# Patient Record
Sex: Female | Born: 1980 | Race: White | Hispanic: No | Marital: Married | Smoking: Current every day smoker
Health system: Southern US, Community
[De-identification: ages and names within clinical notes are randomized; demographics above are authoritative.]

## PROBLEM LIST (undated history)

## (undated) DIAGNOSIS — J189 Pneumonia, unspecified organism: Secondary | ICD-10-CM

## (undated) DIAGNOSIS — Z1379 Encounter for other screening for genetic and chromosomal anomalies: Secondary | ICD-10-CM

## (undated) DIAGNOSIS — R7303 Prediabetes: Secondary | ICD-10-CM

## (undated) DIAGNOSIS — D649 Anemia, unspecified: Secondary | ICD-10-CM

## (undated) DIAGNOSIS — I10 Essential (primary) hypertension: Secondary | ICD-10-CM

## (undated) DIAGNOSIS — G4730 Sleep apnea, unspecified: Secondary | ICD-10-CM

## (undated) DIAGNOSIS — M4800 Spinal stenosis, site unspecified: Secondary | ICD-10-CM

## (undated) DIAGNOSIS — Z9889 Other specified postprocedural states: Secondary | ICD-10-CM

## (undated) DIAGNOSIS — D6852 Prothrombin gene mutation: Secondary | ICD-10-CM

## (undated) DIAGNOSIS — Z86711 Personal history of pulmonary embolism: Secondary | ICD-10-CM

## (undated) DIAGNOSIS — R112 Nausea with vomiting, unspecified: Secondary | ICD-10-CM

## (undated) DIAGNOSIS — I8001 Phlebitis and thrombophlebitis of superficial vessels of right lower extremity: Secondary | ICD-10-CM

## (undated) HISTORY — DX: Personal history of pulmonary embolism: Z86.711

## (undated) HISTORY — DX: Phlebitis and thrombophlebitis of superficial vessels of right lower extremity: I80.01

## (undated) HISTORY — DX: Spinal stenosis, site unspecified: M48.00

## (undated) HISTORY — DX: Essential (primary) hypertension: I10

## (undated) HISTORY — DX: Anemia, unspecified: D64.9

## (undated) HISTORY — DX: Sleep apnea, unspecified: G47.30

## (undated) HISTORY — DX: Prothrombin gene mutation: D68.52

## (undated) HISTORY — PX: EXPLORATORY LAPAROTOMY: SUR591

---

## 2000-05-10 HISTORY — PX: BREAST REDUCTION SURGERY: SHX8

## 2000-05-24 ENCOUNTER — Ambulatory Visit (HOSPITAL_BASED_OUTPATIENT_CLINIC_OR_DEPARTMENT_OTHER): Admission: RE | Admit: 2000-05-24 | Discharge: 2000-05-24 | Payer: Self-pay | Admitting: Specialist

## 2000-05-24 ENCOUNTER — Encounter (INDEPENDENT_AMBULATORY_CARE_PROVIDER_SITE_OTHER): Payer: Self-pay | Admitting: Specialist

## 2000-06-16 ENCOUNTER — Other Ambulatory Visit: Admission: RE | Admit: 2000-06-16 | Discharge: 2000-06-16 | Payer: Self-pay | Admitting: Obstetrics and Gynecology

## 2000-06-28 ENCOUNTER — Inpatient Hospital Stay (HOSPITAL_COMMUNITY): Admission: AD | Admit: 2000-06-28 | Discharge: 2000-06-28 | Payer: Self-pay | Admitting: Gynecology

## 2000-08-06 ENCOUNTER — Other Ambulatory Visit: Admission: RE | Admit: 2000-08-06 | Discharge: 2000-08-06 | Payer: Self-pay | Admitting: Gynecology

## 2000-09-13 ENCOUNTER — Encounter (INDEPENDENT_AMBULATORY_CARE_PROVIDER_SITE_OTHER): Payer: Self-pay | Admitting: Specialist

## 2000-09-13 ENCOUNTER — Other Ambulatory Visit: Admission: RE | Admit: 2000-09-13 | Discharge: 2000-09-13 | Payer: Self-pay | Admitting: Gynecology

## 2001-02-06 ENCOUNTER — Encounter (INDEPENDENT_AMBULATORY_CARE_PROVIDER_SITE_OTHER): Payer: Self-pay

## 2001-02-06 ENCOUNTER — Inpatient Hospital Stay (HOSPITAL_COMMUNITY): Admission: AD | Admit: 2001-02-06 | Discharge: 2001-02-08 | Payer: Self-pay | Admitting: Gynecology

## 2001-03-23 ENCOUNTER — Other Ambulatory Visit: Admission: RE | Admit: 2001-03-23 | Discharge: 2001-03-23 | Payer: Self-pay | Admitting: Gynecology

## 2002-05-24 ENCOUNTER — Other Ambulatory Visit: Admission: RE | Admit: 2002-05-24 | Discharge: 2002-05-24 | Payer: Self-pay | Admitting: Gynecology

## 2003-05-31 ENCOUNTER — Other Ambulatory Visit: Admission: RE | Admit: 2003-05-31 | Discharge: 2003-05-31 | Payer: Self-pay | Admitting: Gynecology

## 2004-06-19 ENCOUNTER — Other Ambulatory Visit: Admission: RE | Admit: 2004-06-19 | Discharge: 2004-06-19 | Payer: Self-pay | Admitting: Gynecology

## 2005-06-23 ENCOUNTER — Other Ambulatory Visit: Admission: RE | Admit: 2005-06-23 | Discharge: 2005-06-23 | Payer: Self-pay | Admitting: Gynecology

## 2007-11-20 ENCOUNTER — Ambulatory Visit: Payer: Self-pay | Admitting: Obstetrics and Gynecology

## 2007-11-20 ENCOUNTER — Inpatient Hospital Stay (HOSPITAL_COMMUNITY): Admission: AD | Admit: 2007-11-20 | Discharge: 2007-11-21 | Payer: Self-pay | Admitting: Obstetrics and Gynecology

## 2007-11-20 ENCOUNTER — Encounter (INDEPENDENT_AMBULATORY_CARE_PROVIDER_SITE_OTHER): Payer: Self-pay | Admitting: *Deleted

## 2008-08-10 HISTORY — PX: APPENDECTOMY: SHX54

## 2009-01-30 ENCOUNTER — Observation Stay (HOSPITAL_COMMUNITY): Admission: EM | Admit: 2009-01-30 | Discharge: 2009-01-31 | Payer: Self-pay | Admitting: Emergency Medicine

## 2009-01-30 ENCOUNTER — Encounter (INDEPENDENT_AMBULATORY_CARE_PROVIDER_SITE_OTHER): Payer: Self-pay | Admitting: General Surgery

## 2010-11-17 LAB — COMPREHENSIVE METABOLIC PANEL
ALT: 23 U/L (ref 0–35)
AST: 16 U/L (ref 0–37)
CO2: 26 mEq/L (ref 19–32)
Calcium: 9.7 mg/dL (ref 8.4–10.5)
Creatinine, Ser: 0.84 mg/dL (ref 0.4–1.2)
GFR calc Af Amer: >60 mL/min (ref >60)
GFR calc non Af Amer: >60 mL/min (ref >60)
Sodium: 138 mEq/L (ref 135–145)
Total Protein: 7 g/dL (ref 6.0–8.3)

## 2010-11-17 LAB — URINALYSIS, ROUTINE W REFLEX MICROSCOPIC
Bilirubin Urine: NEGATIVE
Ketones, ur: NEGATIVE mg/dL
Nitrite: NEGATIVE
Protein, ur: NEGATIVE mg/dL
Urobilinogen, UA: 0.2 mg/dL (ref 0.0–1.0)
pH: 7 (ref 5.0–8.0)

## 2010-11-17 LAB — CBC
MCHC: 33.5 g/dL (ref 30.0–36.0)
MCV: 82.5 fL (ref 78.0–100.0)
Platelets: 246 10*3/uL (ref 150–400)
RDW: 14.6 % (ref 11.5–15.5)

## 2010-11-17 LAB — DIFFERENTIAL
Eosinophils Absolute: 0.1 10*3/uL (ref 0.0–0.7)
Eosinophils Relative: 1 % (ref 0–5)
Lymphocytes Relative: 14 % (ref 12–46)
Lymphs Abs: 2.3 10*3/uL (ref 0.7–4.0)
Monocytes Relative: 10 % (ref 3–12)

## 2010-12-23 NOTE — Op Note (Signed)
NAME:  MATTISEN, POHLMANN NO.:  [id number]   MEDICAL RECORD NO.:  [id number]          PATIENT TYPE:  MAT   LOCATION:  MATC                          FACILITY:  [facility]   PHYSICIAN:  Phil D. Okey Dupre, M.D.     DATE OF BIRTH:  November 03, 1980   DATE OF PROCEDURE:  11/20/2007  DATE OF DISCHARGE:                               OPERATIVE REPORT   PREOPERATIVE DIAGNOSES:  1. Intrauterine pregnancy at term.  2. Nonreassuring fetal heart tones.  3. Occipitoposterior presentation.   POSTOPERATIVE DIAGNOSES:  1. Intrauterine pregnancy at term.  2. Nonreassuring fetal heart tones.  3. Occipitoposterior presentation.   PROCEDURE:  Vacuum-assist vaginal delivery.   SURGEONS:  1. Argentina Donovan, MD  2. Karlton Lemon, MD   ANESTHESIA:  None.   FINDINGS:  1. Viable infant female weighing 7 pounds 0 ounces with Apgars of 9 at 1      minute and 9 at 5 minutes.  2. Right periurethral laceration.  3. Right sulcus laceration.   COMPLICATIONS:  None.   ESTIMATED BLOOD LOSS:  500 mL.   SPECIMENS:  1. Placenta to Pathology.  2. Cord blood to lab.   INDICATIONS FOR PROCEDURE:  Ms. Kidney is a G2, P1-0-0-1 at unspecified  estimated gestational age with no prenatal care, presenting completely  dilated with a bulging bag in the MAU.  Fetal heart tones were 110's  with variable decelerations to the 80s with slow return.  Head was in  the occipitoposterior position, 0 to +1 station.   DESCRIPTION OF PROCEDURE:  After rupture of membranes, the infant was  found to be in occipitoposterior presentation.  Fetal heart tones were  down in the 80s.  A vacuum was placed, being careful not to place a  suction cup over the fontanelle or any vaginal tissue.  The vacuum was  pumped up into the green area on the suction meter and with  contractions, steady traction was applied to the vacuum.  With the third  push, the infant's head was delivered atraumatically.  The vacuum was  removed and a viable infant  female weighing 7 pounds 0 ounces with Apgars  of 9 at 1 and 9 at 5 minutes was delivered at 0115.  The infant cried  spontaneously.  Cord was clamped x2, incised and the infant was handed  to the NICU team in attendance.  Placenta was delivered spontaneously  intact with 3-vessel cord after cord blood was collected.  The  periurethral and right sulcus lacerations were inspected and found to  have some bleeding at the right sulcus tear.  One interrupted 3-0 Vicryl  stitch was placed at the right sulcus tear.  Estimated blood loss was  500 mL.  The patient and the infant tolerated the delivery and procedure  well.  The infant went to the nursery and mother went to the mother/baby  unit in stable condition.      Karlton Lemon, MD  Electronically Signed     ______________________________  Javier Glazier. Okey Dupre, M.D.    NS/MEDQ  D:  11/20/2007  T:  11/20/2007  Job:  [id number]

## 2010-12-23 NOTE — Op Note (Signed)
NAMECIERRIA, HEIGHT NO.:  [id number]   MEDICAL RECORD NO.:  [id number]          PATIENT TYPE:  INP   LOCATION:  5128                         FACILITY:  [facility]   PHYSICIAN:  Angelia Mould. Derrell Lolling, M.D.DATE OF BIRTH:  1981-08-08   DATE OF PROCEDURE:  01/30/2009  DATE OF DISCHARGE:                               OPERATIVE REPORT   PREOPERATIVE DIAGNOSIS:  Acute appendicitis.   POSTOPERATIVE DIAGNOSIS:  Acute appendicitis.   OPERATION PERFORMED:  Laparoscopic appendectomy.   SURGEON:  Angelia Mould. Derrell Lolling, MD   OPERATIVE INDICATIONS:  This is a 30 year old Caucasian female who  presents with a 36- to 48-hour history of right lower quadrant pain,  some fever, some chills, and nausea and vomiting.  She was evaluated in  the emergency room by Dr. Chevis Pretty, who was concerned that she had  appendicitis.  A CT scan shows acute appendicitis with thickening and  distention of the appendix but no abscess or rupture noted.  Dr. Carolynne Edouard  asked me to assume care of the patient.  At this morning, Dr. Carolynne Edouard and I  together visited the patient and proposed that I proceed with  laparoscopic appendectomy.  I discussed that with her, and she was in  full agreement with that plan.   OPERATIVE FINDINGS:  The appendix was acutely inflamed with some exudate  on it, but no evidence of rupture.  It was densely adherent to the  terminal ileum and this made the dissection somewhat slow and tedious.  There was some question of the anatomic relationships initially, but  once I had slowly dissected the appendix away from the terminal ileum, I  could clearly see the junction of the appendix with the cecum and placed  the stapler across it.  We had a frozen section to confirm that this was  the appendix and Dr. Luisa Hart confirmed that this was the appendix and  there was evidence of acute appendicitis.  At the completion of the  case, there was no apparent injury to the cecum or the terminal ileum,  everything appeared intact, and there was no bleeding and staple line  looked good.   OPERATIVE TECHNIQUE:  Following the induction of general endotracheal  anesthesia, a Foley catheter was placed.  Intravenous antibiotics had  just been given.  The abdomen was prepped and draped in sterile fashion.  The patient was identified as the correct patient, correct procedure,  and a time-out was held.  A 0.5% Marcaine with epinephrine was used as a  local infiltration anesthetic.  Transverse incision was made at the  superior rim of the umbilicus.  The fascia was incised in the midline,  and the abdominal cavity entered under direct vision.  An 11-mm Hasson  trocar was inserted and secured with pursestring suture of 0 Vicryl.  Pneumoperitoneum was created.  Video camera was inserted with good  visualization and findings as described above.  It should be noted that  the liver, gallbladder, and stomach and peritoneal surfaces looked  normal.  It should be noted that the right tube and ovary looked normal,  and the terminal  ileum and right colon looked normal.  Only the appendix  was abnormal.  A 5-mm trocar was placed in the left lower quadrant and a  12-mm trocar was placed in the suprapubic area.   As the anatomy was not apparent immediately, we did divide the lateral  peritoneal attachments and mobilized the cecum medially to make sure  there was not a retrocecal appendix, there was not.  We identified what  appeared to be a short inflamed appendix and found the tip of this and  lifted it up and then slowly dissected it away from the surrounding  structures.  More proximally, we had to very carefully dissect with fine  instruments to get the appendix off the terminal ileum, but we were able  to do that without any serosal injury to the terminal ileum at all.  Initially, these structures separated nicely and we divided the  appendiceal mesentery with the harmonic scalpel.  We placed Endo-GIA   stapler across the base of the appendix, closed it, held in place for  about 20 seconds, fired and removed it.  The staple line looked good.  The appendix was placed in the specimen bag and removed.   I examined the appendix on the back table and cut it open and felt that  we had the appendix out.  We sent it to lab for frozen section that  confirmed acute appendicitis.   We had one arterial bleeder during the dissection, which was controlled  with electrocautery, and at the end of the case, we irrigated out  completely and there was no bleeding whatsoever.  We removed all the  irrigation fluid.  Pneumoperitoneum was released.  The trocars were  removed.  The fascia at the umbilicus was closed with 0 Vicryl sutures.  The skin incisions were irrigated and then closed with subcuticular  sutures of 4-0 Monocryl and Steri-Strips.  Clean bandages were placed.  The patient was taken to the recovery room in stable condition.  Estimated blood loss was about 25 mL.  Complications were none.  Sponge,  needle, and instrument counts were correct.      Angelia Mould. Derrell Lolling, M.D.  Electronically Signed     HMI/MEDQ  D:  01/30/2009  T:  01/30/2009  Job:  [id number]

## 2010-12-23 NOTE — H&P (Signed)
NAMESHYE, DOTY NO.:  [id number]   MEDICAL RECORD NO.:  [id number]          PATIENT TYPE:  EMS   LOCATION:  MAJO                         FACILITY:  [facility]   PHYSICIAN:  Ollen Gross. Vernell Morgans, M.D. DATE OF BIRTH:  1980/08/25   DATE OF ADMISSION:  01/30/2009  DATE OF DISCHARGE:                              HISTORY & PHYSICAL   CHIEF COMPLAINT:  Right lower quadrant pain.   Ms. Landen is a 30 year old white female who began having right  abdominal pain on Sunday about 8 p.m.  Initially, it was relatively  generalized and then became more localized during the day to the right  lower quadrant.  She has had fevers and chills at home.  Pain has been  associated with nausea and vomiting.  She denies any chest pain,  shortness of breath, dysuria, no diarrhea.  Her other review of systems  are unremarkable.   PAST MEDICAL HISTORY:  Significant for hemorrhoids.   PAST SURGICAL HISTORY:  Significant for breast reduction.   MEDICATIONS:  None.   ALLERGIES:  None.   SOCIAL HISTORY:  She does smokes about a pack of cigarettes a day and  denies any alcohol use.   FAMILY HISTORY:  Noncontributory.   PHYSICAL EXAMINATION:  VITAL SIGNS:  Her temperature is 100.4, blood  pressure 148/80, and pulse of 120.  GENERAL:  She is a well-developed, well-nourished white female, in no  acute distress.  SKIN:  Warm and dry.  No jaundice.  HEENT:  Eyes are anicteric.  Extraocular movements are intact.  Pupils  are equal, round, and reactive to light.  Sclerae nonicteric.  LUNGS:  Clear bilaterally with no use of accessory respiratory muscles.  HEART:  Regular rate and rhythm with impulse in the left chest.  ABDOMEN:  Soft.  She has moderate right lower quadrant tenderness, but  no peritonitis, no palpable mass or hepatosplenomegaly.  EXTREMITIES:  No cyanosis, clubbing, or edema with good strength in arms  and legs.  PSYCHOLOGIC:  She is alert and oriented x3 with no evidence of  anxiety  or depression.   On review of her lab work, it was significant for a white count of  16,500.  Her urine was clean and pregnancy test was negative.  Her CT  scan was reviewed with the radiologist and was significant for an  enlarged inflamed appendix consistent with appendicitis.   ASSESSMENT AND PLAN:  This is a 30 year old white female with what  appears to be appendicitis.  Because of the risk of perforation and  sepsis, I think she would benefit from having her appendix removed.  She  would also like  to have this done.  I have explained her in detail the risks and  benefits of the operation to remove the appendix as well as some of the  technical aspects, and she understands and wishes to proceed.  We will  plan to do this for this morning in the operating room.      Ollen Gross. Vernell Morgans, M.D.  Electronically Signed     PST/MEDQ  D:  01/30/2009  T:  01/30/2009  Job:  [id number]

## 2010-12-26 NOTE — Discharge Summary (Signed)
[facility]  Patient:    Lorraine Lynch, [place]                      MRN: [id number] Adm. Date:  [id number] Disc. Date: [id number] Attending:  Merrily Pew Dictator:   Antony Contras, [facility]                           Discharge Summary  DISCHARGE DIAGNOSES:          1. Intrauterine pregnancy at term.                               2. Spontaneous onset of labor.  PROCEDURES:                   1. Amnioinfusion secondary to meconium-stained                                  fluid.                               2. Mityvac assisted vaginal delivery of                                  viable infant over midline episiotomy.  HISTORY OF PRESENT ILLNESS:   The patient is a 30 year old, gravida 1, para 0, with an uncomplicated prenatal course. Prenatal record was not available.  HOSPITAL COURSE AND TREATMENT:                The patient was admitted on February 06, 2001 with spontaneous onset of labor. Cervix was 2 to 3 cm at -1 station and 80% effaced. The patient also did have some meconium-stained amniotic fluid. Amnioinfusion was initiated. She did progress to complete dilatation and after prolonged pushing and elevated temperature, delivery was accomplished by Mityvac assistance. She delivered an Apgar 7/9 female infant weighing 8 pounds 15 ounces over midline episiotomy. Pediatric team was in attendance. The infant was DeLeed on the perineum. The placenta was expressed intact.  POSTPARTUM COURSE:            She remained afebrile, had no difficulty voiding, and was able to be discharged in satisfactory condition on her second postpartum day. Since she was nonimmune to rubella, rubella vaccine was given prior to discharge.  CBC: Hematocrit 27.8, hemoglobin 9.4, WBCs 32.5, platelets 251,000.  DISPOSITION:                  The patient is to follow up in six weeks.  DISCHARGE MEDICATIONS:        The patient is to continue with prenatal vitamins and iron, and  Motrin and Tylox for pain.    DD:  03/11/01 TD:  03/13/01 Job: [id number]

## 2011-05-05 LAB — RAPID URINE DRUG SCREEN, HOSP PERFORMED
Amphetamines: NOT DETECTED
Barbiturates: NOT DETECTED
Benzodiazepines: NOT DETECTED
Opiates: NOT DETECTED
Tetrahydrocannabinol: NOT DETECTED

## 2011-05-05 LAB — DIFFERENTIAL
Basophils Relative: 0
Eosinophils Absolute: 0.1
Eosinophils Relative: 0
Lymphs Abs: 2.4
Monocytes Relative: 5

## 2011-05-05 LAB — CBC
HCT: 34 — ABNORMAL LOW
MCHC: 34.2
MCV: 79.9
Platelets: 288
RBC: 4.25
WBC: 16.5 — ABNORMAL HIGH

## 2017-08-10 HISTORY — PX: BACK SURGERY: SHX140

## 2017-10-14 ENCOUNTER — Ambulatory Visit (INDEPENDENT_AMBULATORY_CARE_PROVIDER_SITE_OTHER): Payer: 59 | Admitting: Osteopathic Medicine

## 2017-10-14 ENCOUNTER — Encounter: Payer: Self-pay | Admitting: Osteopathic Medicine

## 2017-10-14 VITALS — BP 129/73 | HR 106 | Temp 98.8°F | Wt 273.0 lb

## 2017-10-14 DIAGNOSIS — Z8249 Family history of ischemic heart disease and other diseases of the circulatory system: Secondary | ICD-10-CM

## 2017-10-14 DIAGNOSIS — I83813 Varicose veins of bilateral lower extremities with pain: Secondary | ICD-10-CM

## 2017-10-14 DIAGNOSIS — M7989 Other specified soft tissue disorders: Secondary | ICD-10-CM | POA: Diagnosis not present

## 2017-10-14 DIAGNOSIS — Z86711 Personal history of pulmonary embolism: Secondary | ICD-10-CM | POA: Diagnosis not present

## 2017-10-14 DIAGNOSIS — Z8632 Personal history of gestational diabetes: Secondary | ICD-10-CM | POA: Diagnosis not present

## 2017-10-14 DIAGNOSIS — I10 Essential (primary) hypertension: Secondary | ICD-10-CM | POA: Insufficient documentation

## 2017-10-14 HISTORY — DX: Personal history of pulmonary embolism: Z86.711

## 2017-10-14 MED ORDER — LISINOPRIL 20 MG PO TABS: 20.0000 mg | ORAL_TABLET | Freq: Every day | ORAL | 3 refills | Status: DC

## 2017-10-14 NOTE — Progress Notes (Signed)
HPI: Lorraine Lynch is a 37 y.o. female who  has a past medical history of High blood pressure.  she presents to [facility] today, 10/14/17,  for chief complaint of: Establish care  Hx Pulmonary embolism, diagnosed shortly after delivery of pregnancy in 2015. Records reviewed, CT angiogram positive for PE right lobe, DVT was negative, echocardiogram was okay. Patient has not had to be on chronic anticoagulation, no other thromboembolic events.  History of gestational diabetes. Has been sometime since last A1c was checked though lab work last year showed okay sugars.  Left lower extremity since to swell a bit more than the right side, though she noted some dependent edema in both on occasion. Varicose veins are bit worse on the left side. No calf pain. No claudication. No shortness of breath or dizziness.  History of hypertension: Has been off of lisinopril for a few days. No history of gestational hypertension or preeclampsia.  Labs about a year ago:  Lipids: TC 211 TG 166 LDL 130 HDL 48 WBC a bit elevated on last labs    Past medical, surgical, social and family history reviewed:  Patient Active Problem List   Diagnosis Date Noted  . History of pulmonary embolism 10/14/2017  . Essential hypertension 10/14/2017  . Family history of MI (myocardial infarction) 10/14/2017    Past Surgical History:  Procedure Laterality Date  . APPENDECTOMY    . BREAST REDUCTION SURGERY  05/2000    Social History   Tobacco Use  . Smoking status: Current Every Day Smoker    Packs/day: 0.50    Types: Cigarettes  . Smokeless tobacco: Never Used  Substance Use Topics  . Alcohol use: No    Frequency: Never    Family History  Problem Relation Age of Onset  . Alcoholism Mother   . Heart attack Mother   . Diabetes Mother   . High Cholesterol Mother   . High blood pressure Mother      Current medication list and allergy/intolerance information  reviewed:    Current Outpatient Medications  Medication Sig Dispense Refill  . aspirin EC 81 MG tablet Take 81 mg by mouth daily.    Marland Kitchen lisinopril (PRINIVIL,ZESTRIL) 20 MG tablet Take 1 tablet (20 mg total) by mouth daily. 90 tablet 3   No current facility-administered medications for this visit.     Allergies  Allergen Reactions  . Latex Hives, Itching and Rash    Burning   . Tuberculin Ppd Dermatitis and Rash      Review of Systems:  Constitutional:  No  fever, no chills, No recent illness, No unintentional weight changes. No significant fatigue.   HEENT: No  headache, no vision change, no hearing change, No sore throat, No  sinus pressure  Cardiac: No  chest pain, No  pressure, No palpitations, No  Orthopnea  Respiratory:  No  shortness of breath. No  Cough  Gastrointestinal: No  abdominal pain, No  nausea, No  vomiting,  No  blood in stool, No  diarrhea, No  constipation   Musculoskeletal: No new myalgia/arthralgia  Skin: No  Rash, No other wounds/concerning lesions  Genitourinary: No  incontinence, No  abnormal genital bleeding, No abnormal genital discharge  Hem/Onc: No  easy bruising/bleeding, No  abnormal lymph node  Endocrine: No cold intolerance,  No heat intolerance. No polyuria/polydipsia/polyphagia   Neurologic: No  weakness, No  dizziness, No  slurred speech/focal weakness/facial droop  Psychiatric: No  concerns with depression, No  concerns with anxiety, No sleep problems, No mood problems  Exam:  BP 129/73 (BP Location: Right Arm)   Pulse (!) 106   Temp 98.8 F (37.1 C) (Oral)   Wt 273 lb 0.6 oz (123.9 kg)   Constitutional: VS see above. General Appearance: alert, well-developed, well-nourished, NAD  Eyes: Normal lids and conjunctive, non-icteric sclera  Ears, Nose, Mouth, Throat: MMM, Normal external inspection ears/nares/mouth/lips/gums. TM normal bilaterally. Pharynx/tonsils no erythema, no exudate. Nasal mucosa normal.   Neck: No masses,  trachea midline. No thyroid enlargement. No tenderness/mass appreciated. No lymphadenopathy  Respiratory: Normal respiratory effort. no wheeze, no rhonchi, no rales  Cardiovascular: S1/S2 normal, no murmur, no rub/gallop auscultated. RRR. No lower extremity edema. Pedal pulse II/IV bilaterally DP and PT. No carotid bruit or JVD. No abdominal aortic bruit.  Gastrointestinal: Nontender, no masses. No hepatomegaly, no splenomegaly. No hernia appreciated. Bowel sounds normal. Rectal exam deferred.   Musculoskeletal: Gait normal. No clubbing/cyanosis of digits.   Neurological: Normal balance/coordination. No tremor. No cranial nerve deficit on limited exam. Motor and sensation intact and symmetric. Cerebellar reflexes intact.   Skin: warm, dry, intact. No rash/ulcer. No concerning nevi or subq nodules on limited exam.    Psychiatric: Normal judgment/insight. Normal mood and affect. Oriented x3.       ASSESSMENT/PLAN:   Essential hypertension - Plan: CBC with Differential/Platelet, COMPLETE METABOLIC PANEL WITH GFR, Lipid panel, Hemoglobin A1c  History of pulmonary embolism  Family history of MI (myocardial infarction) - Plan: Lipid panel  History of gestational diabetes - Plan: Hemoglobin A1c  Swelling of left lower extremity - Plan: TSH  Varicose veins of both lower extremities with pain - worse on left  - Plan: Ambulatory referral to Vascular Surgery      Visit summary with medication list and pertinent instructions was printed for patient to review. All questions at time of visit were answered - patient instructed to contact office with any additional concerns. ER/RTC precautions were reviewed with the patient.   Follow-up plan: Return in about 1 week (around 10/21/2017) for review lab results, recheck blood pressure .    Please note: voice recognition software was used to produce this document, and typos may escape review. Please contact Dr. Sheppard Coil for any needed  clarifications.

## 2017-10-15 DIAGNOSIS — Z8632 Personal history of gestational diabetes: Secondary | ICD-10-CM | POA: Diagnosis not present

## 2017-10-15 DIAGNOSIS — I10 Essential (primary) hypertension: Secondary | ICD-10-CM | POA: Diagnosis not present

## 2017-10-15 DIAGNOSIS — M7989 Other specified soft tissue disorders: Secondary | ICD-10-CM | POA: Diagnosis not present

## 2017-10-15 DIAGNOSIS — Z8249 Family history of ischemic heart disease and other diseases of the circulatory system: Secondary | ICD-10-CM | POA: Diagnosis not present

## 2017-10-16 LAB — COMPLETE METABOLIC PANEL WITH GFR
AG Ratio: 1.7 (calc) (ref 1.0–2.5)
ALKALINE PHOSPHATASE (APISO): 72 U/L (ref 33–115)
ALT: 29 U/L (ref 6–29)
AST: 18 U/L (ref 10–30)
Albumin: 4.2 g/dL (ref 3.6–5.1)
BUN: 14 mg/dL (ref 7–25)
CHLORIDE: 104 mmol/L (ref 98–110)
CO2: 28 mmol/L (ref 20–32)
CREATININE: 0.77 mg/dL (ref 0.50–1.10)
Calcium: 9.8 mg/dL (ref 8.6–10.2)
GFR, Est African American: 115 mL/min/{1.73_m2} (ref >=60)
GFR, Est Non African American: 99 mL/min/{1.73_m2} (ref >=60)
GLUCOSE: 94 mg/dL (ref 65–99)
Globulin: 2.5 g/dL (calc) (ref 1.9–3.7)
Potassium: 4.9 mmol/L (ref 3.5–5.3)
Sodium: 139 mmol/L (ref 135–146)
TOTAL PROTEIN: 6.7 g/dL (ref 6.1–8.1)
Total Bilirubin: 0.4 mg/dL (ref 0.2–1.2)

## 2017-10-16 LAB — CBC WITH DIFFERENTIAL/PLATELET
BASOS ABS: 65 {cells}/uL (ref 0–200)
BASOS PCT: 0.6 %
Eosinophils Absolute: 378 cells/uL (ref 15–500)
Eosinophils Relative: 3.5 %
HCT: 35.6 % (ref 35.0–45.0)
Hemoglobin: 11.6 g/dL — ABNORMAL LOW (ref 11.7–15.5)
LYMPHS ABS: 3035 {cells}/uL (ref 850–3900)
MCH: 24.5 pg — AB (ref 27.0–33.0)
MCHC: 32.6 g/dL (ref 32.0–36.0)
MCV: 75.1 fL — ABNORMAL LOW (ref 80.0–100.0)
MPV: 10.7 fL (ref 7.5–12.5)
Monocytes Relative: 9.3 %
NEUTROS PCT: 58.5 %
Neutro Abs: 6318 cells/uL (ref 1500–7800)
PLATELETS: 352 10*3/uL (ref 140–400)
RBC: 4.74 10*6/uL (ref 3.80–5.10)
RDW: 13.6 % (ref 11.0–15.0)
Total Lymphocyte: 28.1 %
WBC mixed population: 1004 cells/uL — ABNORMAL HIGH (ref 200–950)
WBC: 10.8 10*3/uL (ref 3.8–10.8)

## 2017-10-16 LAB — HEMOGLOBIN A1C
EAG (MMOL/L): 7.1 (calc)
Hgb A1c MFr Bld: 6.1 % of total Hgb — ABNORMAL HIGH (ref <5.7)
MEAN PLASMA GLUCOSE: 128 (calc)

## 2017-10-16 LAB — LIPID PANEL
CHOLESTEROL: 187 mg/dL (ref <200)
HDL: 46 mg/dL — ABNORMAL LOW (ref >50)
LDL CHOLESTEROL (CALC): 120 mg/dL — AB
Non-HDL Cholesterol (Calc): 141 mg/dL (calc) — ABNORMAL HIGH (ref <130)
TRIGLYCERIDES: 107 mg/dL (ref <150)
Total CHOL/HDL Ratio: 4.1 (calc) (ref <5.0)

## 2017-10-16 LAB — TSH: TSH: 1.91 mIU/L

## 2017-10-19 ENCOUNTER — Encounter: Payer: 59 | Admitting: Vascular Surgery

## 2017-10-21 ENCOUNTER — Ambulatory Visit (INDEPENDENT_AMBULATORY_CARE_PROVIDER_SITE_OTHER): Payer: 59 | Admitting: Osteopathic Medicine

## 2017-10-21 ENCOUNTER — Encounter: Payer: Self-pay | Admitting: Osteopathic Medicine

## 2017-10-21 VITALS — BP 129/77 | HR 102 | Temp 98.9°F | Wt 274.1 lb

## 2017-10-21 DIAGNOSIS — D509 Iron deficiency anemia, unspecified: Secondary | ICD-10-CM

## 2017-10-21 DIAGNOSIS — Z8632 Personal history of gestational diabetes: Secondary | ICD-10-CM

## 2017-10-21 DIAGNOSIS — I10 Essential (primary) hypertension: Secondary | ICD-10-CM | POA: Diagnosis not present

## 2017-10-21 NOTE — Progress Notes (Signed)
HPI: Lorraine Lynch is a 37 y.o. female who  has a past medical history of High blood pressure.  she presents to [facility] today, 10/21/17,  for chief complaint of:  Recheck BP Review labs   Hypertension: No chest pain, pressure, shortness breath. Doing well on current blood pressure medication.  Anemia: Microcytic, old labs show normal MCV. Mild low hemoglobin. No history of thalassemia that she is aware of.  History of gestational diabetes, elevated A1c into prediabetic range now. No other A1c's in the past, admits she could be doing a bit better as far as diet/exercise.    Past medical history, surgical history, social history and family history reviewed. No updates needed.   Current medication list and allergy/intolerance information reviewed.    Current Outpatient Medications on File Prior to Visit  Medication Sig Dispense Refill  . aspirin EC 81 MG tablet Take 81 mg by mouth daily.    Marland Kitchen lisinopril (PRINIVIL,ZESTRIL) 20 MG tablet Take 1 tablet (20 mg total) by mouth daily. 90 tablet 3   No current facility-administered medications on file prior to visit.    Allergies  Allergen Reactions  . Latex Hives, Itching and Rash    Burning   . Tuberculin Ppd Dermatitis and Rash      Review of Systems:  Constitutional: No recent illness, +fatigue   HEENT: No  headache, no vision change  Cardiac: No  chest pain, No  pressure, No palpitations  Respiratory:  No  shortness of breath. No  Cough  Hem/Onc: No  easy bruising/bleeding  Psychiatric: No  concerns with depression, No  concerns with anxiety  Exam:  BP 129/77   Pulse (!) 102   Temp 98.9 F (37.2 C) (Oral)   Wt 274 lb 1.3 oz (124.3 kg)   Constitutional: VS see above. General Appearance: alert, well-developed, well-nourished, NAD  Eyes: Normal lids and conjunctive, non-icteric sclera  Ears, Nose, Mouth, Throat: MMM, Normal external inspection  ears/nares/mouth/lips/gums.  Neck: No masses, trachea midline.   Respiratory: Normal respiratory effort. no wheeze, no rhonchi, no rales  Cardiovascular: S1/S2 normal, no murmur, no rub/gallop auscultated. RRR.   Musculoskeletal: Gait normal. Symmetric and independent movement of all extremities  Neurological: Normal balance/coordination. No tremor.  Skin: warm, dry, intact.   Psychiatric: Normal judgment/insight. Normal mood and affect. Oriented x3.     ASSESSMENT/PLAN:   Essential hypertension  History of gestational diabetes - Plan: Hemoglobin A1c  Microcytic anemia - Plan: CBC    Will plan to recheck BLOOD COUNT AND HEMOGLOBIN A1c in 3 months. Discussed if still inbetic range, metformin is an option that she would rather hold off on medications unless absolutely needed. Risks versus benefits discussed, main being evidence for slow progression to diabetes for metformin.      Follow-up plan: Return for labs in 3 months (around 01/21/2018) and as long as labs are good, recheck BP with Dr A  6 mos.  Visit summary with medication list and pertinent instructions was printed for patient to review, alert [place] if any changes needed. All questions at time of visit were answered - patient instructed to contact office with any additional concerns. ER/RTC precautions were reviewed with the patient and understanding verbalized.      Please note: voice recognition software was used to produce this document, and typos may escape review. Please contact Dr. Sheppard Coil for any needed clarifications.

## 2017-11-10 ENCOUNTER — Other Ambulatory Visit: Payer: Self-pay

## 2017-11-10 DIAGNOSIS — I83893 Varicose veins of bilateral lower extremities with other complications: Secondary | ICD-10-CM

## 2017-12-06 ENCOUNTER — Telehealth: Payer: 59 | Admitting: Family

## 2017-12-06 DIAGNOSIS — M545 Low back pain, unspecified: Secondary | ICD-10-CM

## 2017-12-06 MED ORDER — BACLOFEN 10 MG PO TABS: 10.0000 mg | ORAL_TABLET | Freq: Three times a day (TID) | ORAL | 0 refills | Status: DC | PRN

## 2017-12-06 MED ORDER — ETODOLAC 300 MG PO CAPS: 300.0000 mg | ORAL_CAPSULE | Freq: Two times a day (BID) | ORAL | 0 refills | Status: DC

## 2017-12-06 NOTE — Progress Notes (Signed)

## 2017-12-08 ENCOUNTER — Encounter: Payer: 59 | Admitting: Vascular Surgery

## 2017-12-08 ENCOUNTER — Encounter (HOSPITAL_COMMUNITY): Payer: 59

## 2018-01-27 ENCOUNTER — Telehealth: Payer: 59 | Admitting: Physician Assistant

## 2018-01-27 DIAGNOSIS — M549 Dorsalgia, unspecified: Secondary | ICD-10-CM

## 2018-01-27 DIAGNOSIS — M5441 Lumbago with sciatica, right side: Secondary | ICD-10-CM | POA: Diagnosis not present

## 2018-01-27 DIAGNOSIS — R200 Anesthesia of skin: Secondary | ICD-10-CM | POA: Diagnosis not present

## 2018-01-27 DIAGNOSIS — R52 Pain, unspecified: Secondary | ICD-10-CM | POA: Diagnosis not present

## 2018-01-27 DIAGNOSIS — Z87891 Personal history of nicotine dependence: Secondary | ICD-10-CM | POA: Diagnosis not present

## 2018-01-27 DIAGNOSIS — R000 Tachycardia, unspecified: Secondary | ICD-10-CM | POA: Diagnosis not present

## 2018-01-27 DIAGNOSIS — M47896 Other spondylosis, lumbar region: Secondary | ICD-10-CM | POA: Diagnosis not present

## 2018-01-27 DIAGNOSIS — M419 Scoliosis, unspecified: Secondary | ICD-10-CM | POA: Diagnosis not present

## 2018-01-27 NOTE — Progress Notes (Signed)
Based on what you shared with me it looks like you have a serious condition that should be evaluated in a face to face office visit.  Giving severity of pain and numbness/altered sensation you need a physical examination to further assess and so the best treatment can be given. You may need a steroid shot as well.  NOTE: If you entered your credit card information for this eVisit, you will not be charged. You may see a "hold" on your card for the $30 but that hold will drop off and you will not have a charge processed.  If you are having a true medical emergency please call 911.  If you need an urgent face to face visit, [place] has four urgent care centers for your convenience.  If you need care fast and have a high deductible or no insurance consider:   [email or web address] to reserve your spot online an avoid wait times  [facility] [address] [place], [place] [address] 8 am to 8 pm Monday-Friday 10 am to 4 pm Saturday-Sunday *Across the street from [employer]  [place], [address] 8 am to 5 pm Monday-Friday * In the [facility] on the [facility]   The following sites will take your  insurance:  . [facility] [place] a Provider at this Location  [address] [place], [place] [address] . 10 am to 8 pm Monday-Friday . 12 pm to 8 pm Saturday-Sunday   . [facility] [place] a Provider at this Location  [place], [place], [place] [address] . 8 am to 8 pm Monday-Friday . 9 am to 6 pm Saturday . 11 am to 6 pm Sunday   . [facility] [place] Get Driving Directions  [address] [place], [place] [address] . 8 am to 8 pm Monday-Friday . 8 am to 4 pm Saturday-Sunday   Your e-visit answers were  reviewed by a board certified advanced clinical practitioner to complete your personal care plan.  Thank you for using e-Visits.

## 2018-01-28 DIAGNOSIS — M47896 Other spondylosis, lumbar region: Secondary | ICD-10-CM | POA: Diagnosis not present

## 2018-01-28 DIAGNOSIS — M549 Dorsalgia, unspecified: Secondary | ICD-10-CM | POA: Diagnosis not present

## 2018-01-28 DIAGNOSIS — M419 Scoliosis, unspecified: Secondary | ICD-10-CM | POA: Diagnosis not present

## 2018-01-28 DIAGNOSIS — M5441 Lumbago with sciatica, right side: Secondary | ICD-10-CM | POA: Diagnosis not present

## 2018-01-28 DIAGNOSIS — Z87891 Personal history of nicotine dependence: Secondary | ICD-10-CM | POA: Diagnosis not present

## 2018-01-29 ENCOUNTER — Encounter: Payer: Self-pay | Admitting: Osteopathic Medicine

## 2018-01-30 ENCOUNTER — Other Ambulatory Visit: Payer: Self-pay

## 2018-01-30 ENCOUNTER — Emergency Department (INDEPENDENT_AMBULATORY_CARE_PROVIDER_SITE_OTHER)
Admission: EM | Admit: 2018-01-30 | Discharge: 2018-01-30 | Disposition: A | Discharge status: Home / Self Care | Payer: 59 | Source: Home / Self Care | Attending: Family Medicine | Admitting: Family Medicine

## 2018-01-30 ENCOUNTER — Encounter: Payer: Self-pay | Admitting: Emergency Medicine

## 2018-01-30 DIAGNOSIS — M5431 Sciatica, right side: Secondary | ICD-10-CM

## 2018-01-30 MED ORDER — KETOROLAC TROMETHAMINE 60 MG/2ML IM SOLN
60.0000 mg | Freq: Once | INTRAMUSCULAR | Status: AC
  Administered 2018-01-30: 60 mg via INTRAMUSCULAR

## 2018-01-30 MED ORDER — METHYLPREDNISOLONE SODIUM SUCC 125 MG IJ SOLR
80.0000 mg | Freq: Once | INTRAMUSCULAR | Status: AC
  Administered 2018-01-30: 80 mg via INTRAMUSCULAR

## 2018-01-30 MED ORDER — OXYCODONE HCL 5 MG PO CAPS: 5.0000 mg | ORAL_CAPSULE | Freq: Four times a day (QID) | ORAL | 0 refills | Status: DC | PRN

## 2018-01-30 NOTE — ED Provider Notes (Signed)
[facility]    CSN: [id number] Arrival date & time: 01/30/18  1139     History   Chief Complaint Chief Complaint  Patient presents with  . Back Pain    HPI Lorraine Lynch is a 37 y.o. female.   Patient developed vague pain in her right buttock five days ago that progressively worsened, with burning radiation down her right posterior leg and numbness in her right lateral foot.  She recalls no injury or change in activities.  She denies bowel or bladder dysfunction, and no saddle numbness.  She visited the local [facility] three days ago where X-ray of LS spine was negative.  She was administered Toradol and prescribed a prednisone taper and Robaxin.  She begins 30mg  prednisone today. She reports no improvement, and now has increased pain in her right posterior leg with any movement, although no tenderness to palpation.  The history is provided by the patient and the spouse.  Back Pain  Location:  Gluteal region Quality:  Burning and shooting Radiates to:  R posterior upper leg, R foot and R knee Pain severity:  Severe Pain is:  Same all the time Onset quality:  Gradual Duration:  5 days Timing:  Constant Progression:  Worsening Chronicity:  New Context: not physical stress and not recent injury   Relieved by:  Nothing Worsened by:  Movement, lying down, sitting, ambulation, bending and standing Ineffective treatments:  Ibuprofen and muscle relaxants (prednisone) Associated symptoms: leg pain and paresthesias   Associated symptoms: no abdominal pain, no abdominal swelling, no bladder incontinence, no bowel incontinence, no dysuria, no fever, no pelvic pain, no perianal numbness and no weakness   Risk factors: lack of exercise and obesity     Past Medical History:  Diagnosis Date  . High blood pressure     Patient Active Problem List   Diagnosis Date Noted  . History of pulmonary embolism 10/14/2017  . Essential hypertension 10/14/2017  . Family history  of MI (myocardial infarction) 10/14/2017    Past Surgical History:  Procedure Laterality Date  . APPENDECTOMY    . BREAST REDUCTION SURGERY  05/2000    OB History   None      Home Medications    Prior to Admission medications   Medication Sig Start Date End Date Taking? Authorizing Provider  methocarbamol (ROBAXIN) 500 MG tablet Take 500 mg by mouth 4 (four) times daily.   Yes [provider]  predniSONE (DELTASONE) 10 MG tablet Take 10 mg by mouth daily with breakfast.   Yes [provider]  aspirin EC 81 MG tablet Take 81 mg by mouth daily.    [provider]  baclofen (LIORESAL) 10 MG tablet Take 1 tablet (10 mg total) by mouth every 8 (eight) hours as needed for muscle spasms. 12/06/17   Withrow, Elyse Jarvis, FNP  etodolac (LODINE) 300 MG capsule Take 1 capsule (300 mg total) by mouth 2 (two) times daily. 12/06/17   Withrow, Elyse Jarvis, FNP  lisinopril (PRINIVIL,ZESTRIL) 20 MG tablet Take 1 tablet (20 mg total) by mouth daily. 10/14/17   Emeterio Reeve, DO  oxycodone (OXY-IR) 5 MG capsule Take 1 capsule (5 mg total) by mouth every 6 (six) hours as needed for pain. 01/30/18   Kandra Nicolas, MD    Family History Family History  Problem Relation Age of Onset  . Alcoholism Mother   . Heart attack Mother   . Diabetes Mother   . High Cholesterol Mother   .  High blood pressure Mother     Social History Social History   Tobacco Use  . Smoking status: Current Every Day Smoker    Packs/day: 0.50    Types: Cigarettes  . Smokeless tobacco: Never Used  Substance Use Topics  . Alcohol use: No    Frequency: Never  . Drug use: No     Allergies   Latex and Tuberculin ppd   Review of Systems Review of Systems  Constitutional: Negative for fever.  Gastrointestinal: Negative for abdominal pain and bowel incontinence.  Genitourinary: Negative for bladder incontinence, dysuria and pelvic pain.  Musculoskeletal: Positive for back pain.  Neurological:  Positive for paresthesias. Negative for weakness.  All other systems reviewed and are negative.    Physical Exam Triage Vital Signs ED Triage Vitals  Enc Vitals Group     BP 01/30/18 1208 122/85     Pulse Rate 01/30/18 1208 92     Resp --      Temp --      Temp src --      SpO2 01/30/18 1208 100 %     Weight --      Height --      Head Circumference --      Peak Flow --      Pain Score 01/30/18 1209 10     Pain Loc --      Pain Edu? --      Excl. in [place]? --    No data found.  Updated Vital Signs BP 122/85 (BP Location: Right Arm)   Pulse 92   LMP 01/30/2018   SpO2 100%   Visual Acuity Right Eye Distance:   Left Eye Distance:   Bilateral Distance:    Right Eye Near:   Left Eye Near:    Bilateral Near:     Physical Exam  Constitutional: She appears well-developed and well-nourished. She appears distressed.  HENT:  Head: Normocephalic.  Eyes: Pupils are equal, round, and reactive to light.  Neck: Normal range of motion.  Cardiovascular: Normal rate.  Pulmonary/Chest: Effort normal.  Musculoskeletal:       Lumbar back: She exhibits no tenderness.       Legs: Unable to adequately examine patient's back because of her pain and difficulty ambulating.  The distribution of her pain is in the right buttock and right posterior leg although there is no tenderness to palpation in these areas.  Neurological: She is alert.  Skin: Skin is warm and dry.  Nursing note and vitals reviewed.    UC Treatments / Results  Labs (all labs ordered are listed, but only abnormal results are displayed) Labs Reviewed - No data to display  EKG None  Radiology No results found.  Procedures Procedures (including critical care time)  Medications Ordered in UC Medications  methylPREDNISolone sodium succinate (SOLU-MEDROL) 125 mg/2 mL injection 80 mg (has no administration in time range)  ketorolac (TORADOL) injection 60 mg (60 mg Intramuscular Given 01/30/18 1214)    Initial  Impression / Assessment and Plan / UC Course  I have reviewed the triage vital signs and the nursing notes.  Pertinent labs & imaging results that were available during my care of the patient were reviewed by me and considered in my medical decision making (see chart for details).    Administered Toradol 60mg  IM.  Administered Solumedrol 80mg  IM Rx for oxycodone 5mg  (# 15, no refill). Controlled Substance Prescriptions I have consulted the [place] Controlled Substances Registry for this patient, and  feel the risk/benefit ratio today is favorable for proceeding with this prescription for a controlled substance.   Followup with Dr. Aundria Mems or Dr. Lynne Leader ([facility]) as soon as possible for further evaluation.   Final Clinical Impressions(s) / UC Diagnoses   Final diagnoses:  Sciatica of right side     Discharge Instructions     Resume prednisone 10mg  tomorrow, beginning with 3 tabs daily.  May continue Flexeril at bedtime.  Apply ice pack for about 30 minutes, 3 to 4 times daily      ED Prescriptions    Medication Sig Dispense Auth. Provider   oxycodone (OXY-IR) 5 MG capsule Take 1 capsule (5 mg total) by mouth every 6 (six) hours as needed for pain. 15 capsule Kandra Nicolas, MD        Kandra Nicolas, MD 01/31/18 [phone]

## 2018-01-30 NOTE — ED Triage Notes (Signed)
ER dx inflamed sciatica and 2 herniated discs.

## 2018-01-30 NOTE — Discharge Instructions (Signed)
Resume prednisone 10mg  tomorrow, beginning with 3 tabs daily.  May continue Flexeril at bedtime.  Apply ice pack for about 30 minutes, 3 to 4 times daily

## 2018-01-30 NOTE — ED Triage Notes (Signed)
37 y.o presents c/o back pain. States she was seen in the ER on Thursday. She's taken 3 prednisone at 5:30am and 800mg  of Ibuprofen with no relief.

## 2018-01-31 ENCOUNTER — Telehealth: Payer: Self-pay | Admitting: Family Medicine

## 2018-01-31 ENCOUNTER — Ambulatory Visit (INDEPENDENT_AMBULATORY_CARE_PROVIDER_SITE_OTHER): Payer: 59 | Admitting: Family Medicine

## 2018-01-31 ENCOUNTER — Ambulatory Visit (HOSPITAL_COMMUNITY)
Admission: RE | Admit: 2018-01-31 | Discharge: 2018-01-31 | Disposition: A | Discharge status: Home / Self Care | Payer: 59 | Source: Ambulatory Visit | Attending: Family Medicine | Admitting: Family Medicine

## 2018-01-31 ENCOUNTER — Encounter: Payer: Self-pay | Admitting: Family Medicine

## 2018-01-31 VITALS — BP 130/74 | HR 83

## 2018-01-31 DIAGNOSIS — N3289 Other specified disorders of bladder: Secondary | ICD-10-CM | POA: Insufficient documentation

## 2018-01-31 DIAGNOSIS — M5127 Other intervertebral disc displacement, lumbosacral region: Secondary | ICD-10-CM | POA: Diagnosis not present

## 2018-01-31 DIAGNOSIS — M5126 Other intervertebral disc displacement, lumbar region: Secondary | ICD-10-CM | POA: Diagnosis not present

## 2018-01-31 DIAGNOSIS — M5431 Sciatica, right side: Secondary | ICD-10-CM

## 2018-01-31 DIAGNOSIS — M4800 Spinal stenosis, site unspecified: Secondary | ICD-10-CM | POA: Diagnosis not present

## 2018-01-31 DIAGNOSIS — R29898 Other symptoms and signs involving the musculoskeletal system: Secondary | ICD-10-CM

## 2018-01-31 DIAGNOSIS — T148XXA Other injury of unspecified body region, initial encounter: Secondary | ICD-10-CM

## 2018-01-31 DIAGNOSIS — M48061 Spinal stenosis, lumbar region without neurogenic claudication: Secondary | ICD-10-CM | POA: Diagnosis not present

## 2018-01-31 MED ORDER — PREDNISONE 10 MG (48) PO TBPK: ORAL_TABLET | Freq: Every day | ORAL | 0 refills | Status: DC

## 2018-01-31 MED ORDER — METHYLPREDNISOLONE ACETATE 80 MG/ML IJ SUSP
80.0000 mg | Freq: Once | INTRAMUSCULAR | Status: AC
  Administered 2018-01-31: 80 mg via INTRAMUSCULAR

## 2018-01-31 MED ORDER — GABAPENTIN 300 MG PO CAPS: ORAL_CAPSULE | ORAL | 3 refills | Status: DC

## 2018-01-31 NOTE — Progress Notes (Signed)
Subjective:    I'm seeing this patient as a consultation for:  Dr Assunta Found  CC: Lumbar radicular pain  HPI: Lorraine Lynch developed radiating pain down the posterior aspect of her right leg mid week last week.  She was seen in the emergency room at [facility] last week for lumbar x-rays were unremarkable.  She was given oral steroids which has not helped very much at all.  She was seen in urgent care yesterday for the same symptoms and was given an intramuscular injection of Depo-Medrol which helped a little.  She notes severe burning pain down the posterior aspect of her right leg to the lateral foot.  She is not sure if she is weak or not but notes that it is difficult to walk because her leg is painful and numb.  She denies any bowel or bladder dysfunction or left-sided symptoms.  She denies any injury.  She denies any significant prior history of lumbar radicular pain.  She denies any saddle anesthesia.  No fevers or chills.  Past medical history, Surgical history, Family history not pertinant except as noted below, Social history, Allergies, and medications have been entered into the medical record, reviewed, and no changes needed.   Review of Systems: No headache, visual changes, nausea, vomiting, diarrhea, constipation, dizziness, abdominal pain, skin rash, fevers, chills, night sweats, weight loss, swollen lymph nodes, body aches, joint swelling, muscle aches, chest pain, shortness of breath, mood changes, visual or auditory hallucinations.   Objective:    Vitals:   01/31/18 1124  BP: 130/74  Pulse: 83  SpO2: 98%   General: Well Developed, well nourished, and in no acute distress.  Neuro/Psych: Alert and oriented x3, extra-ocular muscles intact, able to move all 4 extremities, sensation grossly intact. Skin: Warm and dry, no rashes noted.  Respiratory: Not using accessory muscles, speaking in full sentences, trachea midline.  Cardiovascular: Pulses palpable, no extremity edema. Abdomen: Does  not appear distended. MSK:  L-spine nontender.  Decreased range of motion due to worsening right leg symptoms. Lower extremity strength normal to hip flexion abduction and adduction bilaterally. Slightly decreased strength 4/5 to right knee extension normal right knee flexion strength. Right foot dorsiflexion right great toe dorsiflexion decreased strength 3/5 Right foot and right great toe plantarflexion decreased 3/5.  Contralateral left leg strength is intact throughout  Reflexes are diminished but equal bilateral knees and ankles. Sensation decreased right leg at S1 distribution  Patient can stand from a seated position but shifts the entirety of her weight to her left leg.  She is unable to do toe standing or heel standing  Lab and Radiology Results Lumbar x-ray report from [facility] reviewed Acute Interface, Incoming Rad Results - 01/28/2018  1:00 AM EDT AP, lateral and coned-down view of the lumbar spine.  HISTORY: Back pain.  There is no acute fracture. No subluxation.  There are degenerative changes of the lumbar spine with endplate sclerosis at Q9-U7.  There is a mild dextroscoliosis which could be positional.   IMPRESSION:  No acute fracture or subluxation.  Mild degenerative changes.  Electronically Signed by: Erline Levine  Impression and Recommendations:    Assessment and Plan: 37 y.o. female with  Right lumbosacral radicular symptoms likely S1 nerve distribution.  Patient with significant pain and moderate numbness.  However she does have weakness at the L5 and S1 nerve root distributions which is a new and worsening symptoms.  This is concerning as she is failing typical early conservative management of steroids.  Plan  to obtain a stat MRI for potential epidural steroid or surgical planning.  If significantly abnormal would consider neurosurgery referral early.  Plan to refill higher dose oral steroids and provide IM Depo-Medrol injection today.  Additionally  will prescribe gabapentin.    Orders Placed This Encounter  Procedures  . MR Lumbar Spine Wo Contrast    Standing Status:   Future    Standing Expiration Date:   04/03/2019    Order Specific Question:   What is the patient's sedation requirement?    Answer:   No Sedation    Order Specific Question:   Does the patient have a pacemaker or implanted devices?    Answer:   No    Order Specific Question:   Preferred imaging location?    Answer:   [facility] (table limit-500 lbs)    Order Specific Question:   Radiology Contrast Protocol - do NOT remove file path    Answer:   [email or web address]   Meds ordered this encounter  Medications  . predniSONE (STERAPRED UNI-PAK 48 TAB) 10 MG (48) TBPK tablet    Sig: Take by mouth daily. 12 day dosepack po    Dispense:  48 tablet    Refill:  0  . gabapentin (NEURONTIN) 300 MG capsule    Sig: One tab PO qHS for a week, then BID for a week, then TID. May double weekly to a max of 3,600mg /day    Dispense:  180 capsule    Refill:  3  . methylPREDNISolone acetate (DEPO-MEDROL) injection 80 mg    Discussed warning signs or symptoms. Please see discharge instructions. Patient expresses understanding.

## 2018-01-31 NOTE — Telephone Encounter (Signed)
Called patient with results.  Patient has distended bladder on MRI and notes that she has not really felt any urgency to urinate since this afternoon.  Scheduled for follow-up tomorrow for recheck and repeat neurologic exam.  Neurosurgery referral put in and will move to urgent referral or emergent referral tomorrow based on exam findings.

## 2018-01-31 NOTE — Patient Instructions (Signed)
Thank you for coming in today. We will try to do an MRI soon.  Let me know if you get worse.  Come back or go to the emergency room if you notice new weakness new numbness problems walking or bowel or bladder problems.  Take gabapentin for pain as needed.  Use prednisone as directed.  Schedule neurosurgery appt a few days after the MRI.    Sciatica Sciatica is pain, numbness, weakness, or tingling along the path of the sciatic nerve. The sciatic nerve starts in the lower back and runs down the back of each leg. The nerve controls the muscles in the lower leg and in the back of the knee. It also provides feeling (sensation) to the back of the thigh, the lower leg, and the sole of the foot. Sciatica is a symptom of another medical condition that pinches or puts pressure on the sciatic nerve. Generally, sciatica only affects one side of the body. Sciatica usually goes away on its own or with treatment. In some cases, sciatica may keep coming back (recur). What are the causes? This condition is caused by pressure on the sciatic nerve, or pinching of the sciatic nerve. This may be the result of:  A disk in between the bones of the spine (vertebrae) bulging out too far (herniated disk).  Age-related changes in the spinal disks (degenerative disk disease).  A pain disorder that affects a muscle in the buttock (piriformis syndrome).  Extra bone growth (bone spur) near the sciatic nerve.  An injury or break (fracture) of the pelvis.  Pregnancy.  Tumor (rare).  What increases the risk? The following factors may make you more likely to develop this condition:  Playing sports that place pressure or stress on the spine, such as football or weight lifting.  Having poor strength and flexibility.  A history of back injury.  A history of back surgery.  Sitting for long periods of time.  Doing activities that involve repetitive bending or lifting.  Obesity.  What are the signs or  symptoms? Symptoms can vary from mild to very severe, and they may include:  Any of these problems in the lower back, leg, hip, or buttock: ? Mild tingling or dull aches. ? Burning sensations. ? Sharp pains.  Numbness in the back of the calf or the sole of the foot.  Leg weakness.  Severe back pain that makes movement difficult.  These symptoms may get worse when you cough, sneeze, or laugh, or when you sit or stand for long periods of time. Being overweight may also make symptoms worse. In some cases, symptoms may recur over time. How is this diagnosed? This condition may be diagnosed based on:  Your symptoms.  A physical exam. Your health care provider may ask you to do certain movements to check whether those movements trigger your symptoms.  You may have tests, including: ? Blood tests. ? X-rays. ? MRI. ? CT scan.  How is this treated? In many cases, this condition improves on its own, without any treatment. However, treatment may include:  Reducing or modifying physical activity during periods of pain.  Exercising and stretching to strengthen your abdomen and improve the flexibility of your spine.  Icing and applying heat to the affected area.  Medicines that help: ? To relieve pain and swelling. ? To relax your muscles.  Injections of medicines that help to relieve pain, irritation, and inflammation around the sciatic nerve (steroids).  Surgery.  Follow these instructions at home: Medicines  Take over-the-counter and prescription medicines only as told by your health care provider.  Do not drive or operate heavy machinery while taking prescription pain medicine. Managing pain  If directed, apply ice to the affected area. ? Put ice in a plastic bag. ? Place a towel between your skin and the bag. ? Leave the ice on for 20 minutes, 2-3 times a day.  After icing, apply heat to the affected area before you exercise or as often as told by your health care  provider. Use the heat source that your health care provider recommends, such as a moist heat pack or a heating pad. ? Place a towel between your skin and the heat source. ? Leave the heat on for 20-30 minutes. ? Remove the heat if your skin turns bright red. This is especially important if you are unable to feel pain, heat, or cold. You may have a greater risk of getting burned. Activity  Return to your normal activities as told by your health care provider. Ask your health care provider what activities are safe for you. ? Avoid activities that make your symptoms worse.  Take brief periods of rest throughout the day. Resting in a lying or standing position is usually better than sitting to rest. ? When you rest for longer periods, mix in some mild activity or stretching between periods of rest. This will help to prevent stiffness and pain. ? Avoid sitting for long periods of time without moving. Get up and move around at least one time each hour.  Exercise and stretch regularly, as told by your health care provider.  Do not lift anything that is heavier than 10 lb (4.5 kg) while you have symptoms of sciatica. When you do not have symptoms, you should still avoid heavy lifting, especially repetitive heavy lifting.  When you lift objects, always use proper lifting technique, which includes: ? Bending your knees. ? Keeping the load close to your body. ? Avoiding twisting. General instructions  Use good posture. ? Avoid leaning forward while sitting. ? Avoid hunching over while standing.  Maintain a healthy weight. Excess weight puts extra stress on your back and makes it difficult to maintain good posture.  Wear supportive, comfortable shoes. Avoid wearing high heels.  Avoid sleeping on a mattress that is too soft or too hard. A mattress that is firm enough to support your back when you sleep may help to reduce your pain.  Keep all follow-up visits as told by your health care provider.  This is important. Contact a health care provider if:  You have pain that wakes you up when you are sleeping.  You have pain that gets worse when you lie down.  Your pain is worse than you have experienced in the past.  Your pain lasts longer than 4 weeks.  You experience unexplained weight loss. Get help right away if:  You lose control of your bowel or bladder (incontinence).  You have: ? Weakness in your lower back, pelvis, buttocks, or legs that gets worse. ? Redness or swelling of your back. ? A burning sensation when you urinate. This information is not intended to replace advice given to you by your health care provider. Make sure you discuss any questions you have with your health care provider. Document Released: 07/21/2001 Document Revised: 12/31/2015 Document Reviewed: 04/05/2015 Elsevier Interactive Patient Education  [employer].

## 2018-02-01 ENCOUNTER — Other Ambulatory Visit: Payer: Self-pay | Admitting: Family Medicine

## 2018-02-01 ENCOUNTER — Ambulatory Visit (INDEPENDENT_AMBULATORY_CARE_PROVIDER_SITE_OTHER): Payer: 59 | Admitting: Family Medicine

## 2018-02-01 ENCOUNTER — Ambulatory Visit: Payer: 59 | Admitting: Osteopathic Medicine

## 2018-02-01 ENCOUNTER — Encounter: Payer: Self-pay | Admitting: Family Medicine

## 2018-02-01 ENCOUNTER — Ambulatory Visit (HOSPITAL_BASED_OUTPATIENT_CLINIC_OR_DEPARTMENT_OTHER)
Admission: RE | Admit: 2018-02-01 | Discharge: 2018-02-01 | Disposition: A | Discharge status: Home / Self Care | Payer: 59 | Source: Ambulatory Visit | Attending: Family Medicine | Admitting: Family Medicine

## 2018-02-01 VITALS — BP 126/77 | HR 71

## 2018-02-01 DIAGNOSIS — N83201 Unspecified ovarian cyst, right side: Secondary | ICD-10-CM | POA: Diagnosis not present

## 2018-02-01 DIAGNOSIS — N3289 Other specified disorders of bladder: Secondary | ICD-10-CM

## 2018-02-01 DIAGNOSIS — M5431 Sciatica, right side: Secondary | ICD-10-CM | POA: Diagnosis not present

## 2018-02-01 DIAGNOSIS — R939 Diagnostic imaging inconclusive due to excess body fat of patient: Secondary | ICD-10-CM | POA: Diagnosis not present

## 2018-02-01 DIAGNOSIS — R937 Abnormal findings on diagnostic imaging of other parts of musculoskeletal system: Secondary | ICD-10-CM

## 2018-02-01 DIAGNOSIS — R1909 Other intra-abdominal and pelvic swelling, mass and lump: Secondary | ICD-10-CM

## 2018-02-01 DIAGNOSIS — N83209 Unspecified ovarian cyst, unspecified side: Secondary | ICD-10-CM | POA: Insufficient documentation

## 2018-02-01 MED ORDER — OXYCODONE HCL 5 MG PO CAPS: 5.0000 mg | ORAL_CAPSULE | Freq: Four times a day (QID) | ORAL | 0 refills | Status: DC | PRN

## 2018-02-01 NOTE — Progress Notes (Signed)
Lorraine Lynch is a 37 y.o. female who presents to [facility] today for follow-up right-sided sciatica.  Lorraine Lynch was seen yesterday for right leg sciatica symptoms associated with weakness.  MRI was performed and showed compression of the S1 nerve root.  However MRI findings at the time were concerning for distended bladder.  Patient notes that she was not feeling much urgency to urinate but able to have a normal urination.  She notes that she does not have bowel movement for the last several days.  She notes that with the steroids given in clinic yesterday her pain is a bit better controlled.  Additionally she is taking gabapentin and oxycodone which does help control pain.  She denies any worsening weakness or numbness.  She denies any saddle anesthesia.   ROS as above:  Exam:  BP 126/77   Pulse 71   LMP 01/30/2018   SpO2 98%  Gen: Well NAD HEENT: EOMI,  MMM Lungs: Normal work of breathing. CTABL Heart: RRR no MRG Abd: NABS, Soft. Nondistended, Nontender Exts: Brisk capillary refill, warm and well perfused.  Neuro alert and oriented. Lower extremity strength Right hip flexion 5/5, left hip flexion 5/5 Right hip abduction 5/5, left hip abduction 5/5 Right hip adduction 5/5, left hip adduction 5/5 Right knee extension 4+/5 limited by worsening radicular symptoms, left knee extension 5/5 Right knee flexion 5/5, left knee flexion 5/5 Right foot dorsiflexion 4+/5, left foot dorsiflexion 5/5 Right foot plantarflexion 4/5, left foot plantarflexion 5/5  Sensation significantly decreased along the S1 nerve distribution.  Normal sensation left leg no saddle anesthesia.  Positive right-sided slump test.  Rectal tone is intact with voluntary squeeze.      Lab and Radiology Results  Bedside ultrasound to assess bladder showed a large circular anechoic structure with  some pulsatile motion concerning for large ovarian cyst or potentially extremely distended bladder  No results found for this or any previous visit (from the past 72 hour(s)). Mr Lumbar Spine Wo Contrast  Result Date: 01/31/2018 CLINICAL DATA:  Weakness of the lower extremity. Right-sided sciatica. Cauda equina syndrome suspected. EXAM: MRI LUMBAR SPINE WITHOUT CONTRAST TECHNIQUE: Multiplanar, multisequence MR imaging of the lumbar spine was performed. No intravenous contrast was administered. COMPARISON:  CT of the abdomen and pelvis 01/30/2009. FINDINGS: Segmentation: 5 non rib-bearing lumbar type vertebral bodies are present. The lowest fully formed vertebral body is L5. Alignment: AP alignment is anatomic. Rightward curvature is centered at L3-4. Vertebrae:  Chronic endplate marrow changes are present at L5-S1. Conus medullaris and cauda equina: Conus extends to the L2 level. Conus and cauda equina appear normal. Paraspinal and other soft tissues: The urinary bladder extends superiorly to the level of L4-5. Limited imaging of the abdomen is otherwise unremarkable. No significant adenopathy is present. Disc levels: This levels at L4-5 and above are normal. L5-S1: A large right paramedian disc protrusion is present. Severe right subarticular narrowing is present. Facet spurring contributes to moderate foraminal stenosis bilaterally, right greater than left. Prominent epidural fat is noted as well. IMPRESSION: 1. 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 2. Moderate foraminal stenosis bilaterally at L5-S1 secondary to disc disease and facet spurring, right greater than left. 3. No significant disease at L4-5 or above. 4. Moderate distention of the urinary bladder could be neurogenic. Electronically Signed   By: San Morelle M.D.   On:  01/31/2018 17:03   US Pelvic Complete W Transvaginal And Torsion R/o  Result  Date: 02/01/2018 CLINICAL DATA:  Abnormal lumbar spine MRI demonstrating large pelvic ovarian cyst EXAM: TRANSABDOMINAL AND TRANSVAGINAL ULTRASOUND OF PELVIS DOPPLER ULTRASOUND OF OVARIES TECHNIQUE: Both transabdominal and transvaginal ultrasound examinations of the pelvis were performed. Transabdominal technique was performed for global imaging of the pelvis including uterus, ovaries, adnexal regions, and pelvic cul-de-sac. It was necessary to proceed with endovaginal exam following the transabdominal exam to visualize the uterus and adnexae in better detail. Color and duplex Doppler ultrasound was utilized to evaluate blood flow to the ovaries. COMPARISON:  01/31/2018 FINDINGS: Uterus Measurements: 8.5 x 5.4 x 5.7 cm. No fibroids or other mass visualized. Endometrium Thickness: 8.9 mm.  No focal abnormality visualized. Right ovary Measurements: 13.6 x 9.9 x 18.5 cm. Right ovary contains a large anechoic minimally complex cyst measuring 12.8 x 13.4 x 12.1 cm. Slight peripheral wall thickening/nodularity. This correlates with the lumbar spine MRI finding. Left ovary Measurements: 3.5 x 2.3 x 3.5 cm. Normal appearance/no adnexal mass. Pulsed Doppler evaluation of both ovaries demonstrates normal low-resistance arterial and venous waveforms. Other findings Trace pelvic free fluid IMPRESSION: 12.8 cm right ovarian minimally complex large ovarian cyst correlates with the lumbar spine MRI finding. Since these may be difficult to assess completely with [place], further evaluation of cysts >7 cm with MRI or surgical evaluation is recommended according to the Society of Radiologists in Ultrasound 2010 Consensus Conference Statement (D Clovis Riley et al. Management of Asymptomatic Ovarian and other Adnexal Cysts Imaged at [place]: Society of Radiologists in [place] Statement 2010. Radiology 256 (Sept 2010): 094-709.). No other acute finding by pelvic ultrasound Electronically Signed   By: Jerilynn Mages.  Shick M.D.   On:  02/01/2018 12:38  I personally (independently) visualized and performed the interpretation of the images attached in this note.     Assessment and Plan: 37 y.o. female with  Right S1 radiculopathy: Slight improvement in symptoms and improvement in strength.  Normal rectal tone and no significantly distended bladder.  Plan to proceed with epidural steroid injection.  Additionally as patient is weak I think is reasonable to go ahead and referred to neurosurgery.  I am hopeful that she will either have had her epidural steroid injection or have some improvement before her neurosurgery appointment.  During the exam I obtained an ultrasound of her abdomen to assess for a potentially very distended bladder.  She had a large cystic structure of the did not resemble a bladder and I proceeded with a formal pelvic ultrasound which showed a large ovarian mass.  Plan to obtain Ca125 and refer to OB/GYN after discussing with Dr. Hulan Fray.   Orders Placed This Encounter  Procedures  . DG Epidurography    Order Specific Question:   Reason for Exam (SYMPTOM  OR DIAGNOSIS REQUIRED)    Answer:   right S1    Order Specific Question:   Is the patient pregnant?    Answer:   No    Order Specific Question:   Preferred imaging location?    Answer:   [id number]   Meds ordered this encounter  Medications  . oxycodone (OXY-IR) 5 MG capsule    Sig: Take 1 capsule (5 mg total) by mouth every 6 (six) hours as needed for pain.    Dispense:  15 capsule    Refill:  0     Historical information moved to improve visibility of documentation.  Past Medical History:  Diagnosis Date  . High blood pressure    Past Surgical History:  Procedure Laterality Date  . APPENDECTOMY    . BREAST REDUCTION SURGERY  05/2000   Social History   Tobacco Use  . Smoking status: Current Every Day Smoker    Packs/day: 0.50    Types: Cigarettes  . Smokeless tobacco: Never Used  Substance Use Topics  . Alcohol use: No     Frequency: Never   family history includes Alcoholism in her mother; Diabetes in her mother; Heart attack in her mother; High Cholesterol in her mother; High blood pressure in her mother.  Medications: Current Outpatient Medications  Medication Sig Dispense Refill  . aspirin EC 81 MG tablet Take 81 mg by mouth daily.    . baclofen (LIORESAL) 10 MG tablet Take 1 tablet (10 mg total) by mouth every 8 (eight) hours as needed for muscle spasms. 30 each 0  . etodolac (LODINE) 300 MG capsule Take 1 capsule (300 mg total) by mouth 2 (two) times daily. 20 capsule 0  . gabapentin (NEURONTIN) 300 MG capsule One tab PO qHS for a week, then BID for a week, then TID. May double weekly to a max of 3,600mg /day 180 capsule 3  . lisinopril (PRINIVIL,ZESTRIL) 20 MG tablet Take 1 tablet (20 mg total) by mouth daily. 90 tablet 3  . methocarbamol (ROBAXIN) 500 MG tablet Take 500 mg by mouth 4 (four) times daily.    Marland Kitchen oxycodone (OXY-IR) 5 MG capsule Take 1 capsule (5 mg total) by mouth every 6 (six) hours as needed for pain. 15 capsule 0  . predniSONE (STERAPRED UNI-PAK 48 TAB) 10 MG (48) TBPK tablet Take by mouth daily. 12 day dosepack po 48 tablet 0   No current facility-administered medications for this visit.    Allergies  Allergen Reactions  . Latex Hives, Itching and Rash    Burning   . Tuberculin Ppd Dermatitis and Rash     Discussed warning signs or symptoms. Please see discharge instructions. Patient expresses understanding.

## 2018-02-01 NOTE — Patient Instructions (Signed)
Thank you for coming in today. You should hear about back injection.  Go to [facility] for pelvis ultrasound now.  Recheck with me as needed.  Keep me informed.    Sciatica Sciatica is pain, numbness, weakness, or tingling along the path of the sciatic nerve. The sciatic nerve starts in the lower back and runs down the back of each leg. The nerve controls the muscles in the lower leg and in the back of the knee. It also provides feeling (sensation) to the back of the thigh, the lower leg, and the sole of the foot. Sciatica is a symptom of another medical condition that pinches or puts pressure on the sciatic nerve. Generally, sciatica only affects one side of the body. Sciatica usually goes away on its own or with treatment. In some cases, sciatica may keep coming back (recur). What are the causes? This condition is caused by pressure on the sciatic nerve, or pinching of the sciatic nerve. This may be the result of:  A disk in between the bones of the spine (vertebrae) bulging out too far (herniated disk).  Age-related changes in the spinal disks (degenerative disk disease).  A pain disorder that affects a muscle in the buttock (piriformis syndrome).  Extra bone growth (bone spur) near the sciatic nerve.  An injury or break (fracture) of the pelvis.  Pregnancy.  Tumor (rare).  What increases the risk? The following factors may make you more likely to develop this condition:  Playing sports that place pressure or stress on the spine, such as football or weight lifting.  Having poor strength and flexibility.  A history of back injury.  A history of back surgery.  Sitting for long periods of time.  Doing activities that involve repetitive bending or lifting.  Obesity.  What are the signs or symptoms? Symptoms can vary from mild to very severe, and they may include:  Any of these problems in the lower back, leg, hip, or buttock: ? Mild tingling or dull  aches. ? Burning sensations. ? Sharp pains.  Numbness in the back of the calf or the sole of the foot.  Leg weakness.  Severe back pain that makes movement difficult.  These symptoms may get worse when you cough, sneeze, or laugh, or when you sit or stand for long periods of time. Being overweight may also make symptoms worse. In some cases, symptoms may recur over time. How is this diagnosed? This condition may be diagnosed based on:  Your symptoms.  A physical exam. Your health care provider may ask you to do certain movements to check whether those movements trigger your symptoms.  You may have tests, including: ? Blood tests. ? X-rays. ? MRI. ? CT scan.  How is this treated? In many cases, this condition improves on its own, without any treatment. However, treatment may include:  Reducing or modifying physical activity during periods of pain.  Exercising and stretching to strengthen your abdomen and improve the flexibility of your spine.  Icing and applying heat to the affected area.  Medicines that help: ? To relieve pain and swelling. ? To relax your muscles.  Injections of medicines that help to relieve pain, irritation, and inflammation around the sciatic nerve (steroids).  Surgery.  Follow these instructions at home: Medicines  Take over-the-counter and prescription medicines only as told by your health care provider.  Do not drive or operate heavy machinery while taking prescription pain medicine. Managing pain  If directed, apply ice to the  affected area. ? Put ice in a plastic bag. ? Place a towel between your skin and the bag. ? Leave the ice on for 20 minutes, 2-3 times a day.  After icing, apply heat to the affected area before you exercise or as often as told by your health care provider. Use the heat source that your health care provider recommends, such as a moist heat pack or a heating pad. ? Place a towel between your skin and the heat  source. ? Leave the heat on for 20-30 minutes. ? Remove the heat if your skin turns bright red. This is especially important if you are unable to feel pain, heat, or cold. You may have a greater risk of getting burned. Activity  Return to your normal activities as told by your health care provider. Ask your health care provider what activities are safe for you. ? Avoid activities that make your symptoms worse.  Take brief periods of rest throughout the day. Resting in a lying or standing position is usually better than sitting to rest. ? When you rest for longer periods, mix in some mild activity or stretching between periods of rest. This will help to prevent stiffness and pain. ? Avoid sitting for long periods of time without moving. Get up and move around at least one time each hour.  Exercise and stretch regularly, as told by your health care provider.  Do not lift anything that is heavier than 10 lb (4.5 kg) while you have symptoms of sciatica. When you do not have symptoms, you should still avoid heavy lifting, especially repetitive heavy lifting.  When you lift objects, always use proper lifting technique, which includes: ? Bending your knees. ? Keeping the load close to your body. ? Avoiding twisting. General instructions  Use good posture. ? Avoid leaning forward while sitting. ? Avoid hunching over while standing.  Maintain a healthy weight. Excess weight puts extra stress on your back and makes it difficult to maintain good posture.  Wear supportive, comfortable shoes. Avoid wearing high heels.  Avoid sleeping on a mattress that is too soft or too hard. A mattress that is firm enough to support your back when you sleep may help to reduce your pain.  Keep all follow-up visits as told by your health care provider. This is important. Contact a health care provider if:  You have pain that wakes you up when you are sleeping.  You have pain that gets worse when you lie  down.  Your pain is worse than you have experienced in the past.  Your pain lasts longer than 4 weeks.  You experience unexplained weight loss. Get help right away if:  You lose control of your bowel or bladder (incontinence).  You have: ? Weakness in your lower back, pelvis, buttocks, or legs that gets worse. ? Redness or swelling of your back. ? A burning sensation when you urinate. This information is not intended to replace advice given to you by your health care provider. Make sure you discuss any questions you have with your health care provider. Document Released: 07/21/2001 Document Revised: 12/31/2015 Document Reviewed: 04/05/2015 Elsevier Interactive Patient Education  [employer].

## 2018-02-07 DIAGNOSIS — I8001 Phlebitis and thrombophlebitis of superficial vessels of right lower extremity: Secondary | ICD-10-CM

## 2018-02-07 HISTORY — DX: Phlebitis and thrombophlebitis of superficial vessels of right lower extremity: I80.01

## 2018-02-08 ENCOUNTER — Ambulatory Visit
Admission: RE | Admit: 2018-02-08 | Discharge: 2018-02-08 | Disposition: A | Discharge status: Home / Self Care | Payer: 59 | Source: Ambulatory Visit | Attending: Family Medicine | Admitting: Family Medicine

## 2018-02-08 DIAGNOSIS — M5126 Other intervertebral disc displacement, lumbar region: Secondary | ICD-10-CM | POA: Diagnosis not present

## 2018-02-08 HISTORY — PX: OTHER SURGICAL HISTORY: SHX169

## 2018-02-08 MED ORDER — IOPAMIDOL (ISOVUE-M 200) INJECTION 41%
1.0000 mL | Freq: Once | INTRAMUSCULAR | Status: AC
  Administered 2018-02-08: 1 mL via EPIDURAL

## 2018-02-08 MED ORDER — METHYLPREDNISOLONE ACETATE 40 MG/ML INJ SUSP (RADIOLOG
120.0000 mg | Freq: Once | INTRAMUSCULAR | Status: AC
  Administered 2018-02-08: 120 mg via EPIDURAL

## 2018-02-08 NOTE — Discharge Instructions (Signed)

## 2018-02-14 ENCOUNTER — Encounter (INDEPENDENT_AMBULATORY_CARE_PROVIDER_SITE_OTHER): Payer: Self-pay

## 2018-02-16 ENCOUNTER — Ambulatory Visit (HOSPITAL_BASED_OUTPATIENT_CLINIC_OR_DEPARTMENT_OTHER)
Admission: RE | Admit: 2018-02-16 | Discharge: 2018-02-16 | Disposition: A | Discharge status: Home / Self Care | Payer: 59 | Source: Ambulatory Visit | Attending: Family Medicine | Admitting: Family Medicine

## 2018-02-16 ENCOUNTER — Ambulatory Visit (INDEPENDENT_AMBULATORY_CARE_PROVIDER_SITE_OTHER): Payer: 59 | Admitting: Family Medicine

## 2018-02-16 ENCOUNTER — Telehealth: Payer: Self-pay | Admitting: Family Medicine

## 2018-02-16 ENCOUNTER — Encounter: Payer: Self-pay | Admitting: Family Medicine

## 2018-02-16 VITALS — BP 132/77 | Ht 66.0 in | Wt 272.0 lb

## 2018-02-16 DIAGNOSIS — Z8249 Family history of ischemic heart disease and other diseases of the circulatory system: Secondary | ICD-10-CM | POA: Diagnosis not present

## 2018-02-16 DIAGNOSIS — M7989 Other specified soft tissue disorders: Secondary | ICD-10-CM

## 2018-02-16 DIAGNOSIS — R600 Localized edema: Secondary | ICD-10-CM | POA: Diagnosis not present

## 2018-02-16 DIAGNOSIS — I8001 Phlebitis and thrombophlebitis of superficial vessels of right lower extremity: Secondary | ICD-10-CM | POA: Insufficient documentation

## 2018-02-16 DIAGNOSIS — N83201 Unspecified ovarian cyst, right side: Secondary | ICD-10-CM | POA: Diagnosis not present

## 2018-02-16 DIAGNOSIS — M5431 Sciatica, right side: Secondary | ICD-10-CM | POA: Diagnosis not present

## 2018-02-16 DIAGNOSIS — H5213 Myopia, bilateral: Secondary | ICD-10-CM | POA: Diagnosis not present

## 2018-02-16 MED ORDER — ELIQUIS 5 MG VTE STARTER PACK
ORAL_TABLET | ORAL | 0 refills | Status: DC
Start: 2018-02-16 — End: 2018-02-17

## 2018-02-16 NOTE — Patient Instructions (Signed)
Thank you for coming in today. Get the ultrasound today at 730 at [facility].  I will contact you with results definitely by tomorrow but tonight maybe.  Expect either a blocked number or a [phone].   If you do have a DVT I will prescribe Eliquis to a 24 hour pharmacy.  You have the coupons.   Recheck with me as need guided by results.   Apixaban oral tablets What is this medicine? APIXABAN (a PIX a ban) is an anticoagulant (blood thinner). It is used to lower the chance of stroke in people with a medical condition called atrial fibrillation. It is also used to treat or prevent blood clots in the lungs or in the veins. This medicine may be used for other purposes; ask your health care provider or pharmacist if you have questions. COMMON BRAND NAME(S): Eliquis What should I tell my health care provider before I take this medicine? They need to know if you have any of these conditions: -bleeding disorders -bleeding in the brain -blood in your stools (black or tarry stools) or if you have blood in your vomit -history of stomach bleeding -kidney disease -liver disease -mechanical heart valve -an unusual or allergic reaction to apixaban, other medicines, foods, dyes, or preservatives -pregnant or trying to get pregnant -breast-feeding How should I use this medicine? Take this medicine by mouth with a glass of water. Follow the directions on the prescription label. You can take it with or without food. If it upsets your stomach, take it with food. Take your medicine at regular intervals. Do not take it more often than directed. Do not stop taking except on your doctor's advice. Stopping this medicine may increase your risk of a blot clot. Be sure to refill your prescription before you run out of medicine. Talk to your pediatrician regarding the use of this medicine in children. Special care may be needed. Overdosage: If you think you have taken too much of this medicine contact a  poison control center or emergency room at once. NOTE: This medicine is only for you. Do not share this medicine with others. What if I miss a dose? If you miss a dose, take it as soon as you can. If it is almost time for your next dose, take only that dose. Do not take double or extra doses. What may interact with this medicine? This medicine may interact with the following: -aspirin and aspirin-like medicines -certain medicines for fungal infections like ketoconazole and itraconazole -certain medicines for seizures like carbamazepine and phenytoin -certain medicines that treat or prevent blood clots like warfarin, enoxaparin, and dalteparin -clarithromycin -NSAIDs, medicines for pain and inflammation, like ibuprofen or naproxen -rifampin -ritonavir -St. John's wort This list may not describe all possible interactions. Give your health care provider a list of all the medicines, herbs, non-prescription drugs, or dietary supplements you use. Also tell them if you smoke, drink alcohol, or use illegal drugs. Some items may interact with your medicine. What should I watch for while using this medicine? Visit your doctor or health care professional for regular checks on your progress. Notify your doctor or health care professional and seek emergency treatment if you develop breathing problems; changes in vision; chest pain; severe, sudden headache; pain, swelling, warmth in the leg; trouble speaking; sudden numbness or weakness of the face, arm or leg. These can be signs that your condition has gotten worse. If you are going to have surgery or other procedure, tell your doctor that  you are taking this medicine. What side effects may I notice from receiving this medicine? Side effects that you should report to your doctor or health care professional as soon as possible: -allergic reactions like skin rash, itching or hives, swelling of the face, lips, or tongue -signs and symptoms of bleeding such as  bloody or black, tarry stools; red or dark-brown urine; spitting up blood or brown material that looks like coffee grounds; red spots on the skin; unusual bruising or bleeding from the eye, gums, or nose This list may not describe all possible side effects. Call your doctor for medical advice about side effects. You may report side effects to FDA at 1-800-FDA-1088. Where should I keep my medicine? Keep out of the reach of children. Store at room temperature between 20 and 25 degrees C (68 and 77 degrees F). Throw away any unused medicine after the expiration date. NOTE: This sheet is a summary. It may not cover all possible information. If you have questions about this medicine, talk to your doctor, pharmacist, or health care provider.  2018 Elsevier/Gold Standard (2016-02-17 11:54:23)

## 2018-02-16 NOTE — Telephone Encounter (Signed)
Prelim results of doppler US indicate DVT.  Called pt and prescribed Eliquis. STOP ASA while on eliquis.  Will send formal results TMR.

## 2018-02-17 ENCOUNTER — Encounter: Payer: Self-pay | Admitting: Family Medicine

## 2018-02-17 ENCOUNTER — Telehealth: Payer: Self-pay | Admitting: Family Medicine

## 2018-02-17 DIAGNOSIS — M5431 Sciatica, right side: Secondary | ICD-10-CM | POA: Insufficient documentation

## 2018-02-17 MED ORDER — APIXABAN 5 MG PO TABS: ORAL_TABLET | ORAL | 0 refills | Status: DC

## 2018-02-17 NOTE — Telephone Encounter (Signed)
Eliquis starterpack not available.  Will create same prescription with 5mg  eliquis pills.

## 2018-02-17 NOTE — Progress Notes (Signed)
Lorraine Lynch is a 37 y.o. female who presents to [facility]: [place] today for right leg swelling. Sebrina was seen about 2 weeks ago for right leg weakness and pain thought to be related to sciatica or lumbar radiculopathy.  She had an MRI and recently had an epidural steroid injection.  In addition to that time she was found to have a large ovarian cyst.  She was doing reasonably well with continued pain following her epidural steroid injection when she noticed significant swelling of her right leg.  She has a history of pulmonary embolism but is not sure which leg the DVT came from.  She notes the leg swelling started yesterday the day before.  She denies current chest pain or shortness of breath.  She is concerned that she may have a new DVT.  She has not tried treatment yet.   ROS as above:  Exam:  BP 132/77   Ht 5\' 6"  (1.676 m)   Wt 272 lb (123.4 kg)   LMP 01/30/2018   BMI 43.90 kg/m  Gen: Well NAD HEENT: EOMI,  MMM Lungs: Normal work of breathing. CTABL Heart: RRR no MRG Abd: NABS, Soft. Nondistended, Nontender Exts: Brisk capillary refill, warm and well perfused.  Right leg calf diameter larger than left.  Calf is mildly tender to palpation.  Capillary refill and pulses are intact.  No palpable cords.  Lab and Radiology Results Doppler ultrasound evaluation indicates DVT at the right gastrocnemius vein near the intersection with the popliteal vein.  Formal radiology results are still pending.  This did result was not available at the time of patient discharge from clinic.    Assessment and Plan: 37 y.o. female with  Right leg swelling likely due to DVT based on preliminary ultrasound results.  Final read still pending.  At the time of discharge the ultrasound results are not known but DVT was highly suspected.  Patient has been contacted with results and Eliquis has  been prescribed.  Plan to await final read and will recheck in the near future.  Note patient has an evaluation upcoming in the next week with OB/GYN to evaluate her ovarian cyst and as she has not benefited much from her epidural steroid injection she also has an appointment scheduled with neurosurgery in the near future.  Her anticoagulation will certainly conflict with both potential treatment plan and options.  Work with her PCP to continue to manage this problem.   Orders Placed This Encounter  Procedures  . US Venous Img Lower Bilateral    Standing Status:   Future    Number of Occurrences:   1    Standing Expiration Date:   04/20/2019    Order Specific Question:   Reason for Exam (SYMPTOM  OR DIAGNOSIS REQUIRED)    Answer:   rt leg swelling    Order Specific Question:   Preferred imaging location?    Answer:   [facility]    Order Specific Question:   Call Results- Best Contact Number?    Answer:   [phone]   No orders of the defined types were placed in this encounter.    Historical information moved to improve visibility of documentation.  Past Medical History:  Diagnosis Date  . High blood pressure   . History of pulmonary embolism 10/14/2017   Past Surgical History:  Procedure Laterality Date  . APPENDECTOMY    . BREAST REDUCTION SURGERY  05/2000  Social History   Tobacco Use  . Smoking status: Current Every Day Smoker    Packs/day: 0.50    Types: Cigarettes  . Smokeless tobacco: Never Used  Substance Use Topics  . Alcohol use: No    Frequency: Never   family history includes Alcoholism in her mother; Diabetes in her mother; Heart attack in her mother; High Cholesterol in her mother; High blood pressure in her mother.  Medications: Current Outpatient Medications  Medication Sig Dispense Refill  . baclofen (LIORESAL) 10 MG tablet Take 1 tablet (10 mg total) by mouth every 8 (eight) hours as needed for muscle spasms. 30 each 0  . gabapentin  (NEURONTIN) 300 MG capsule One tab PO qHS for a week, then BID for a week, then TID. May double weekly to a max of 3,600mg /day 180 capsule 3  . lisinopril (PRINIVIL,ZESTRIL) 20 MG tablet Take 1 tablet (20 mg total) by mouth daily. 90 tablet 3  . methocarbamol (ROBAXIN) 500 MG tablet Take 500 mg by mouth 4 (four) times daily.    Marland Kitchen oxycodone (OXY-IR) 5 MG capsule Take 1 capsule (5 mg total) by mouth every 6 (six) hours as needed for pain. 15 capsule 0  . ELIQUIS STARTER PACK (ELIQUIS STARTER PACK) 5 MG TABS Take as directed on package: start with two-5mg  tablets twice daily for 7 days. On day 8, switch to one-5mg  tablet twice daily. 1 each 0   No current facility-administered medications for this visit.    Allergies  Allergen Reactions  . Latex Hives, Itching and Rash    Burning   . Tuberculin Ppd Dermatitis and Rash     Discussed warning signs or symptoms. Please see discharge instructions. Patient expresses understanding.

## 2018-02-22 DIAGNOSIS — I10 Essential (primary) hypertension: Secondary | ICD-10-CM | POA: Diagnosis not present

## 2018-02-22 DIAGNOSIS — Z6841 Body Mass Index (BMI) 40.0 and over, adult: Secondary | ICD-10-CM | POA: Diagnosis not present

## 2018-02-22 DIAGNOSIS — M5126 Other intervertebral disc displacement, lumbar region: Secondary | ICD-10-CM | POA: Diagnosis not present

## 2018-02-23 ENCOUNTER — Ambulatory Visit (INDEPENDENT_AMBULATORY_CARE_PROVIDER_SITE_OTHER): Payer: 59 | Admitting: Obstetrics & Gynecology

## 2018-02-23 ENCOUNTER — Encounter: Payer: Self-pay | Admitting: Obstetrics & Gynecology

## 2018-02-23 VITALS — BP 139/70 | HR 96 | Ht 66.0 in | Wt 269.0 lb

## 2018-02-23 DIAGNOSIS — Z8632 Personal history of gestational diabetes: Secondary | ICD-10-CM

## 2018-02-23 DIAGNOSIS — D649 Anemia, unspecified: Secondary | ICD-10-CM | POA: Diagnosis not present

## 2018-02-23 DIAGNOSIS — N83201 Unspecified ovarian cyst, right side: Secondary | ICD-10-CM | POA: Diagnosis not present

## 2018-02-23 NOTE — Addendum Note (Signed)
Addended by: Lyndal Rainbow on: 02/23/2018 10:49 AM   Modules accepted: Orders

## 2018-02-23 NOTE — Progress Notes (Signed)
Last pap smear- March 2018  PT was sent by Dr. Georgina Snell for an ovarian cyst. U/S in chart.

## 2018-02-23 NOTE — Progress Notes (Signed)
   Subjective:    Patient ID: Providence Lanius, female    DOB: September 12, 1980, 37 y.o.   MRN: [id number]  HPI 37 yo separated P4 here for evaluation of a right ovarian cyst with some nodularity. It measures 12 cm. She has pain every month, sometimes so severe that she cannot get up, has vomitting. This has been occurring within the last year.   She has a DVT now and had a PE postpartum 3 years ago.   Review of Systems She uses condoms for contraception. She works at [employer]- no breast/gyn/colon cancer. She reports a normal pap in [place] in 2018.    Objective:   Physical Exam Breathing, conversing, and ambulating normally Well nourished, well hydrated White female, no apparent distress     Assessment & Plan:  Large ovarian cyst- check CA-125 Refer to gyn onc

## 2018-02-24 ENCOUNTER — Telehealth: Payer: Self-pay

## 2018-02-24 NOTE — Telephone Encounter (Addendum)
Pt is aware of this and states she already has an appt set up with Dr.Corey for this issue.   ----- Message from Emily Filbert, MD sent at 02/24/2018 11:15 AM EDT ----- Please let her know that her hba1c is still up. She needs to address that with her primary care MD, Dr. Georgina Snell.

## 2018-02-25 ENCOUNTER — Telehealth: Payer: Self-pay | Admitting: Family Medicine

## 2018-02-25 DIAGNOSIS — O2230 Deep phlebothrombosis in pregnancy, unspecified trimester: Secondary | ICD-10-CM

## 2018-02-25 LAB — CBC
HEMATOCRIT: 36.8 % (ref 35.0–45.0)
Hemoglobin: 11.6 g/dL — ABNORMAL LOW (ref 11.7–15.5)
MCH: 24.3 pg — AB (ref 27.0–33.0)
MCHC: 31.5 g/dL — ABNORMAL LOW (ref 32.0–36.0)
MCV: 77.1 fL — AB (ref 80.0–100.0)
MPV: 10.4 fL (ref 7.5–12.5)
PLATELETS: 340 10*3/uL (ref 140–400)
RBC: 4.77 10*6/uL (ref 3.80–5.10)
RDW: 14.5 % (ref 11.0–15.0)
WBC: 11.5 10*3/uL — ABNORMAL HIGH (ref 3.8–10.8)

## 2018-02-25 LAB — HEMOGLOBIN A1C
Hgb A1c MFr Bld: 6.1 % of total Hgb — ABNORMAL HIGH (ref <5.7)
Mean Plasma Glucose: 128 (calc)
eAG (mmol/L): 7.1 (calc)

## 2018-02-25 LAB — CA 125: CA 125: 28 U/mL (ref <35)

## 2018-02-25 NOTE — Telephone Encounter (Signed)
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 Jasman Murri:  Yes, she needs a w/u!!  I will be more than happy to see her.  Just send [place] the referral notice!!!  Thanks!!!  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                 Hello Dr Marin Olp,   I have a patient who has had 2 sort of provoked DVTs. 1 post partum PE and now a DVT after steroids and decreased mobility with a sciatica episode. She notes that her father's side of the family is less well known but her PGM had multiple DVTs. She had limited workup after her 1st PE with a reported negative Factor V liden.  My questions: 1) Will she benefit from work up? 2) Will eliquis interfere with work-up? 3) do you think she needs a formal referral evaluation with you in clinic?  Thanks, Ellard Artis

## 2018-02-28 NOTE — Progress Notes (Signed)
[place] [facility]   Consult Note: New Patient FIRST VISIT   Consult was requested by Dr. Clovia Cuff for a large adnexal mass.   Chief Complaint  Patient presents with  . Right ovarian cyst    HPI: [name]  is a very nice 37 y.o.  P4  She had noted some back pain and visited with a Family and Sports Medicine doctor (Dr. Lynne Leader). Imaging was ordered - 01/31/18 MRI revealed a large right paramedian disc protrusion at L5-S1 with severe right subarticular stenosis, likely impacting the right S1 nerve roots. 2. Moderate foraminal stenosis bilaterally at L5-S1 secondary to disc disease and facet spurring, right greater than left. 3. No significant disease at L4-5 or above. 4. Moderate distention of the urinary bladder could be neurogenic. Besides the worrisome orthopedic findings Dr. Georgina Snell thought the distended bladder was odd and ordered followup imaging with a TVUS  - 02/01/18 - TVUS showed  Right ovary 13.6 x 9.9 x 18.5 cm. Right ovary contains a large anechoic minimally complex cyst 12.8 x 13.4 x 12.1 cm. Finally in addition to the above issues she had a LE doppler ordered 7/10 due to complaints of LE edema/pain that revealed IMPRESSION: 1. No evidence of acute or chronic DVT within either lower extremity. 2. Examination is positive for occlusive superficial thrombophlebitis involving right lesser saphenous vein, again, without extension to the deep venous system of the right lower extremity.  She was referred to neurosurgery for the spinal stenosis and surgery is scheduled with them in one week. She was referred to Gyn (Dr. Hulan Fray) for the adnexal mass. She was referred to Heme-Onc for the occlusive superficial thrombophlebitis and placed on Eliquis (NOTE she has a PMH of pulmonary embolus). This appointment is scheduled for Mar 25, 2018.  Dr. Hulan Fray with Gyn saw the patient and ordered tumor marker CA125 02/23/18 = 28. Given the size of  the lesion she was referred for recommendations regarding the adnexal mass.  She has noted some RLQ pain and intermittent vomiting since Nov 2018. No nausea however. Denies changes in bowels, denies weight loss.  Does have a h/o pelvic "prolapse" and dysmenorrhea with menses cyclic Z61+/- days. She had a progesterone IUD at one point but said it was painful.   Imported EPIC Oncologic History: TBD   No history exists.   Measurement of disease:  Recent Labs    02/23/18 1049  CA125 28    . Normal preop  Radiology: 02/01/18 - TVUS - Uterus - 8.5 x 5.4 x 5.7 cm. No fibroids or other mass visualized. Endometrium Thickness: 8.9 mm.  No focal abnormality visualized. Right ovary 13.6 x 9.9 x 18.5 cm. Right ovary contains a large anechoic minimally complex cyst 12.8 x 13.4 x 12.1 cm. Slight peripheral wall thickening/nodularity. This correlates with the lumbar spine MRI finding. Left ovary 3.5 x 2.3 x 3.5 cm. Normal appearance/no adnexal mass. Pulsed Doppler evaluation of both ovaries demonstrates normal low-resistance arterial and venous waveforms. Other findings Trace pelvic free fluid IMPRESSION: 12.8 cm right ovarian minimally complex large ovarian cyst correlates with the lumbar spine MRI finding. US Venous Img Lower Bilateral  Result Date: 02/17/2018 CLINICAL DATA:  Bilateral lower extremity pain and edema, right greater than left. History of pulmonary embolism. Evaluate for DVT. EXAM: BILATERAL LOWER EXTREMITY VENOUS DOPPLER ULTRASOUND TECHNIQUE: Gray-scale sonography with graded compression, as well as color Doppler and duplex ultrasound were performed to evaluate the lower extremity  deep venous systems from the level of the common femoral vein and including the common femoral, femoral, profunda femoral, popliteal and calf veins including the posterior tibial, peroneal and gastrocnemius veins when visible. The superficial great saphenous vein was also interrogated. Spectral Doppler was utilized  to evaluate flow at rest and with distal augmentation maneuvers in the common femoral, femoral and popliteal veins. COMPARISON:  None. FINDINGS: RIGHT LOWER EXTREMITY Common Femoral Vein: No evidence of thrombus. Normal compressibility, respiratory phasicity and response to augmentation. Saphenofemoral Junction: No evidence of thrombus. Normal compressibility and flow on color Doppler imaging. Profunda Femoral Vein: No evidence of thrombus. Normal compressibility and flow on color Doppler imaging. Femoral Vein: No evidence of thrombus. Normal compressibility, respiratory phasicity and response to augmentation. Popliteal Vein: No evidence of thrombus. Normal compressibility, respiratory phasicity and response to augmentation. Calf Veins: No evidence of thrombus. Normal compressibility and flow on color Doppler imaging. Superficial Great Saphenous Vein: No evidence of thrombus. Normal compressibility. Venous Reflux:  None. Other Findings: There is hypoechoic occlusive thrombus within the right lesser saphenous vein (image 24 and 25 and 37), without extension to the adjacent right popliteal vein. LEFT LOWER EXTREMITY Common Femoral Vein: No evidence of thrombus. Normal compressibility, respiratory phasicity and response to augmentation. Saphenofemoral Junction: No evidence of thrombus. Normal compressibility and flow on color Doppler imaging. Profunda Femoral Vein: No evidence of thrombus. Normal compressibility and flow on color Doppler imaging. Femoral Vein: No evidence of thrombus. Normal compressibility, respiratory phasicity and response to augmentation. Popliteal Vein: No evidence of thrombus. Normal compressibility, respiratory phasicity and response to augmentation. Calf Veins: No evidence of thrombus. Normal compressibility and flow on color Doppler imaging. Superficial Great Saphenous Vein: No evidence of thrombus. Normal compressibility. Venous Reflux:  None. Other Findings:  None. IMPRESSION: 1. No evidence  of acute or chronic DVT within either lower extremity. 2. Examination is positive for occlusive superficial thrombophlebitis involving right lesser saphenous vein, again, without extension to the deep venous system of the right lower extremity. Electronically Signed   By: Sandi Mariscal M.D.   On: 02/17/2018 09:40      Outpatient Encounter Medications as of 03/02/2018  Medication Sig  . gabapentin (NEURONTIN) 300 MG capsule One tab PO qHS for a week, then BID for a week, then TID. May double weekly to a max of 3,600mg /day (Patient taking differently: as needed. One tab PO qHS for a week, then BID for a week, then TID. May double weekly to a max of 3,600mg /day)  . lisinopril (PRINIVIL,ZESTRIL) 20 MG tablet Take 1 tablet (20 mg total) by mouth daily.  . [DISCONTINUED] apixaban (ELIQUIS) 5 MG TABS tablet Take 2 tablets (10 mg total) by mouth 2 (two) times daily for 7 days, THEN 1 tablet (5 mg total) 2 (two) times daily for 23 days.  . [DISCONTINUED] ibuprofen (ADVIL,MOTRIN) 200 MG tablet Take 200 mg by mouth every 6 (six) hours as needed.  . [DISCONTINUED] baclofen (LIORESAL) 10 MG tablet Take 1 tablet (10 mg total) by mouth every 8 (eight) hours as needed for muscle spasms. (Patient not taking: Reported on 03/02/2018)  . [DISCONTINUED] methocarbamol (ROBAXIN) 500 MG tablet Take 500 mg by mouth 4 (four) times daily.  . [DISCONTINUED] oxycodone (OXY-IR) 5 MG capsule Take 1 capsule (5 mg total) by mouth every 6 (six) hours as needed for pain. (Patient not taking: Reported on 03/02/2018)   No facility-administered encounter medications on file as of 03/02/2018.    Allergies  Allergen Reactions  . Latex  Hives, Itching and Rash    Burning   . Tuberculin Ppd Dermatitis and Rash    Past Medical History:  Diagnosis Date  . Anemia   . DVT (deep venous thrombosis) (HCC)    Right Leg  . High blood pressure   . History of pulmonary embolism 10/14/2017   Past Surgical History:  Procedure Laterality Date  .  APPENDECTOMY    . BREAST REDUCTION SURGERY  05/2000  . OTHER SURGICAL HISTORY  02/08/2018   Epidural steriod injection      Past Gynecological History:   GYNECOLOGIC HISTORY:  Patient's last menstrual period was 02/19/2018. Menarche: 37 years old P 4 Contraceptive  5 years OCP HRT none  Last Pap 10/2017 negative (no HPV done) Family Hx:  Family History  Problem Relation Age of Onset  . Alcoholism Mother   . Heart attack Mother   . Diabetes Mother   . High Cholesterol Mother   . High blood pressure Mother   . Non-Hodgkin's lymphoma Maternal Uncle    Social Hx:  Tobacco use: current daily Alcohol use: 1/ week Illicit Drug use: none Illicit IV Drug use: none    Review of Systems: Review of Systems  Constitutional: Positive for fatigue.  Genitourinary: Positive for pelvic pain.   Musculoskeletal: Positive for back pain.  All other systems reviewed and are negative.   Vitals:  Last menstrual period 02/19/2018. Vitals:   03/02/18 0851  Weight: 271 lb 1.6 oz (123 kg)  Height: 5\' 6"  (1.676 m)    Body mass index is 43.76 kg/m.   Physical Exam:  General :  Obese, well developed, 37 y.o., female in no apparent distress HEENT:  Normocephalic/atraumatic, symmetric, EOMI, eyelids normal Neck:   Supple, no masses.  Lymphatics:  No cervical/ submandibular/ supraclavicular/ infraclavicular/ inguinal adenopathy Respiratory:  Respirations unlabored, no use of accessory muscles CV:   Deferred Breast:  Deferred Musculoskeletal: No CVA tenderness, normal muscle strength. Abdomen:  Obese with small pannus. Soft, non-tender and nondistended. No evidence of hernia. ? Mass, difficult to discern given habitus Extremities:  No lymphedema, no erythema, non-tender. Skin:   Normal inspection Neuro/Psych:  No focal motor deficit, no abnormal mental status. Normal gait. Normal affect. Alert and oriented to person, place, and time  Genito Urinary: Vulva: Normal external female genitalia.   Bladder/urethra: Urethral meatus normal in size and location. No lesions or   masses, bladder with slight relaxation anterior vaginal wall Speculum exam: Vagina: +rectocele. No lesion, no discharge, no bleeding. Cervix: Difficult to visualize given relaxation , palpates normal Bimanual exam:  Uterus: mobile. Unable to palpate fundus due to habitus  Adnexa: No masses. Rectovaginal:  Good tone, no masses, no cul de sac nodularity, no parametrial involvement or nodularity.   Assessment  Right ovarian cyst Dysmenorrhea Pelvic relaxation Back pain with spinal stenosis H/o PE and ? Current superficial thrombophlebitis on Eliquis  Plan  1. Rectocele and dysmenorrhea o If we don't end up doing a hysterectomy/BSO then will defer to Dr. Hulan Fray o She does splint with bowel movements so this may require surgical management by Dr. Hulan Fray or urogyn. We discussed this today and can readdress on future visit. 2. Back pain with spinal stenosis o I advised her to keep her plan for surgery next week as the findings on the MRI, in my opinion, seem more urgent than the adnexal mass 3. H/o PE and now occlusive superficial thrombophlebitis o Will await Heme-onc recommendations as to whether she should remain on Eliquis and holding  for our procedure 4. Data reviewed  ? I independently reviewed the images and discussed my interpretation in the presence of the patient today. ? Overall I feel the adnexal lesion is mostly cystic and I have a low concern for malignancy ? I reviewed her referring doctor's office notes ? We reviewed the normal tumor marker and the reassurance that can provide in conjunction with the ultrasound images 5. Decision for surgery o Although I favor a benign process the mass is large (making the adnexa measure 13x18cm) and I do have concern she may have had and may still risk torsion at some point given her prior emesis/pain episode. o Surgery will provide pathology and hopefully  reassurance. 2. Treatment planning o The lesion is too large for laparoscopic approach thus I have recommended an open oophorectomy and bilateral salpingectomy (since she has completed childbearing), possible TAH/BSO/Staging ? We reviewed the risks/benefits/alternatives of the surgery and the surgical sketch. She was given a copy of the sketch today. 3. Pending items ? Heme-Onc recommendations ? Undergo spine surgery and find out when they clear her for laying on her back for laparotomy.  Face to face time with patient was >60 minutes. Over 50% of this time was spent on counseling and coordination of care.    Isabel Caprice, MD  03/16/2018, 5:56 PM   Cc: Clovia Cuff, MD (Referring Ob/Gyn) Emeterio Reeve, DO (PCP)

## 2018-03-02 ENCOUNTER — Inpatient Hospital Stay: Payer: 59 | Attending: Obstetrics | Admitting: Obstetrics

## 2018-03-02 ENCOUNTER — Encounter: Payer: Self-pay | Admitting: Obstetrics

## 2018-03-02 ENCOUNTER — Telehealth: Payer: Self-pay | Admitting: Oncology

## 2018-03-02 VITALS — BP 131/70 | HR 80 | Temp 98.5°F | Resp 20 | Ht 66.0 in | Wt 271.1 lb

## 2018-03-02 DIAGNOSIS — N946 Dysmenorrhea, unspecified: Secondary | ICD-10-CM | POA: Insufficient documentation

## 2018-03-02 DIAGNOSIS — N83201 Unspecified ovarian cyst, right side: Secondary | ICD-10-CM | POA: Diagnosis not present

## 2018-03-02 DIAGNOSIS — N816 Rectocele: Secondary | ICD-10-CM | POA: Insufficient documentation

## 2018-03-02 NOTE — Telephone Encounter (Signed)
Caren Griffins called back and said Dr. Cyndy Freeze is in surgery today.  She will ask him tomorrow about when patient can have surgery and call back.

## 2018-03-02 NOTE — Patient Instructions (Addendum)
1) We will reach out to your neurosurgeon to see when you would be cleared to proceed with the surgery with Dr. Gerarda Fraction.  2) We will also reach out to the Medical Oncologist you will be seeing to obtain recommendations about your Eliquis and anticoagulation around surgery.                  Preparing for your Surgery  A surgical date will be decided upon once we have obtained the above information from #1 and #2.  Once we have this information, you will meet with Joylene John, NP before surgery to finalize surgical instructions, answer questions etc.  Plan for surgery with Dr. Precious Haws at [facility] will be scheduled for an exploratory laparotomy, unilateral salpingo-oophorectomy, bilateral salpingectomy, possible total abdominal hysterectomy, possible staging if cancer identified.   Pre-operative Testing -You will receive a phone call from presurgical testing at [facility] to arrange for a pre-operative testing appointment before your surgery.  This appointment normally occurs one to two weeks before your scheduled surgery.   -Bring your insurance card, copy of an advanced directive if applicable, medication list  -At that visit, you will be asked to sign a consent for a possible blood transfusion in case a transfusion becomes necessary during surgery.  The need for a blood transfusion is rare but having consent is a necessary part of your care.     -You should not be taking blood thinners or aspirin at least ten days prior to surgery unless instructed by your surgeon.  Day Before Surgery at [facility] will need to take in full liquids the day before surgery.  Full liquids include creamy soups, ice cream, yogurt.  Avoid carbonated beverages.  You will be advised to have nothing to eat or drink after midnight the evening before.    If your bowels are filled with gas, your surgeon will have difficulty visualizing your pelvic organs which increases your surgical  risks.  Your role in recovery Your role is to become active as soon as directed by your doctor, while still giving yourself time to heal.  Rest when you feel tired. You will be asked to do the following in order to speed your recovery:  - Cough and breathe deeply. This helps toclear and expand your lungs and can prevent pneumonia. You may be given a spirometer to practice deep breathing. A staff member will show you how to use the spirometer. - Do mild physical activity. Walking or moving your legs help your circulation and body functions return to normal. A staff member will help you when you try to walk and will provide you with simple exercises. Do not try to get up or walk alone the first time. - Actively manage your pain. Managing your pain lets you move in comfort. We will ask you to rate your pain on a scale of zero to 10. It is your responsibility to tell your doctor or nurse where and how much you hurt so your pain can be treated.  Special Considerations -If you are diabetic, you may be placed on insulin after surgery to have closer control over your blood sugars to promote healing and recovery.  This does not mean that you will be discharged on insulin.  If applicable, your oral antidiabetics will be resumed when you are tolerating a solid diet.  -Your final pathology results from surgery should be available by the Friday after surgery and the results will be relayed  to you when available.  -Dr. Lahoma Crocker is the Surgeon that assists your GYN Oncologist with surgery.  The next day after your surgery you will either see your GYN Oncologist or Dr. Lahoma Crocker.   Blood Transfusion Information WHAT IS A BLOOD TRANSFUSION? A transfusion is the replacement of blood or some of its parts. Blood is made up of multiple cells which provide different functions.  Red blood cells carry oxygen and are used for blood loss replacement.  White blood cells fight against  infection.  Platelets control bleeding.  Plasma helps clot blood.  Other blood products are available for specialized needs, such as hemophilia or other clotting disorders. BEFORE THE TRANSFUSION  Who gives blood for transfusions?   You may be able to donate blood to be used at a later date on yourself (autologous donation).  Relatives can be asked to donate blood. This is generally not any safer than if you have received blood from a stranger. The same precautions are taken to ensure safety when a relative's blood is donated.  Healthy volunteers who are fully evaluated to make sure their blood is safe. This is blood bank blood. Transfusion therapy is the safest it has ever been in the practice of medicine. Before blood is taken from a donor, a complete history is taken to make sure that person has no history of diseases nor engages in risky social behavior (examples are intravenous drug use or sexual activity with multiple partners). The donor's travel history is screened to minimize risk of transmitting infections, such as malaria. The donated blood is tested for signs of infectious diseases, such as HIV and hepatitis. The blood is then tested to be sure it is compatible with you in order to minimize the chance of a transfusion reaction. If you or a relative donates blood, this is often done in anticipation of surgery and is not appropriate for emergency situations. It takes many days to process the donated blood. RISKS AND COMPLICATIONS Although transfusion therapy is very safe and saves many lives, the main dangers of transfusion include:   Getting an infectious disease.  Developing a transfusion reaction. This is an allergic reaction to something in the blood you were given. Every precaution is taken to prevent this. The decision to have a blood transfusion has been considered carefully by your caregiver before blood is given. Blood is not given unless the benefits outweigh the  risks.  About Rectocele  Overview  A rectocele is a type of hernia which causes different degrees of bulging of the rectal tissues into the vaginal wall.  You may even notice that it presses against the vaginal wall so much that some vaginal tissues droop outside of the opening of your vagina.  Causes of Rectocele  The most common cause is childbirth.  The muscles and ligaments in the pelvis that hold up and support the female organs and vagina become stretched and weakened during labor and delivery.  The more babies you have, the more the support tissues are stretched and weakened.  Not everyone who has a baby will develop a rectocele.  Some women have stronger supporting tissue in the pelvis and may not have as much of a problem as others.  Women who have a Cesarean section usually do not get rectocele's unless they pushed a long time prior to the cesarean delivery.  Other conditions that can cause a rectocele include chronic constipation, a chronic cough, a lot of heavy lifting, and obesity.  Older women  may have this problem because the loss of female hormones causes the vaginal tissue to become weaker.  Symptoms  There may not be any symptoms.  If you do have symptoms, they may include:  Pelvic pressure in the rectal area  Protrusion of the lower part of the vagina through the opening of the vagina  Constipation and trapping of the stool, making it difficult to have a bowel movement.  In severe cases, you may have to press on the lower part of your vagina to help push the stool out of you rectum.  This is called splinting to empty.  Diagnosing Rectocele  Your health care provider will ask about your symptoms and perform a pelvic exam.  S/he will ask you to bear down, pushing like you are having a bowel movement so as to see how far the lower part of the vagina protrudes into the vagina and possible outside of the vagina.  Your provider will also ask you to contract the muscles of your  pelvis (like you are stopping the stream in the middle of urinating) to determine the strength of your pelvic muscles.  Your provider may also do a rectal exam.  Treatment Options  If you do not have any symptoms, no treatment may be necessary.  Other treatment options include:  Pelvic floor exercises: Contracting the muscles in your genital area may help strengthen your muscles and support the organs.  Be sure to get proper exercise instruction from you physical therapist.  A pessary (removealbe pelvic support device) sometimes helps rectocele symptoms.  Surgery: Surgical repair may be necessary. In some cases the uterus may need to be taken out ( a hysterectomy) as well.  There are many types of surgery for pelvic support problems.  Look for physicians who specialize in repair procedures.  You can take care of yourself by:  Treating and preventing constipation  Avoiding heavy lifting, and lifting correctly (with your legs, not with you waist or back)  Treating a chronic cough or bronchitis  Not smoking  avoiding too much weight gain  Doing pelvic floor exercises   2007, [employer] Doc.33  Full Liquid Diet A full liquid diet may be used:  To help you transition from a clear liquid diet to a soft diet.  When your body is healing and can only tolerate foods that are easy to digest.  Before or after certain a procedure, test, or surgery (such as stomach or intestinal surgeries).  If you have trouble swallowing or chewing.  A full liquid diet includes fluids and foods that are liquid or will become liquid at room temperature. The full liquid diet gives you the proteins, fluids, salts, and minerals that you need for energy. If you continue this diet for more than 72 hours, talk to your health care provider about how many calories you need to consume. If you continue the diet for more than 5 days, talk to your health care provider about taking a multivitamin or a  nutritional supplement. What do I need to know about a full liquid diet?  You may have any liquid.  You may have any food that becomes a liquid at room temperature. The food is considered a liquid if it can be poured off a spoon at room temperature.  Drink one serving of citrus or vitamin C-enriched fruit juice daily. What foods can I eat? Grains Any grain food that can be pureed in soup (such as crackers, pasta, and rice). Hot cereal (such as  farina or oatmeal) that has been blended. Talk to your health care provider or dietitian about these foods. Vegetables Pulp-free tomato or vegetable juice. Vegetables pureed in soup. Fruits Fruit juice, including nectars and juices with pulp. Meats and Other Protein Sources Eggs in custard, eggnog mix, and eggs used in ice cream or pudding. Strained meats, like in baby food, may be allowed. Consult your health care provider. Dairy Milk and milk-based beverages, including milk shakes and instant breakfast mixes. Smooth yogurt. Pureed cottage cheese. Avoid these foods if they are not well tolerated. Beverages All beverages, including liquid nutritional supplements. Ask your health care provider if you can have carbonated beverages. They may not be well tolerated. Condiments Iodized salt, pepper, spices, and flavorings. Cocoa powder. Vinegar, ketchup, yellow mustard, smooth sauces (such as hollandaise, cheese sauce, or white sauce), and soy sauce. Sweets and Desserts Custard, smooth pudding. Flavored gelatin. Tapioca, junket. Plain ice cream, sherbet, fruit ices. Frozen ice pops, frozen fudge pops, pudding pops, and other frozen bars with cream. Syrups, including chocolate syrup. Sugar, honey, jelly. Fats and Oils Margarine, butter, cream, sour cream, and oils. Other Broth and cream soups. Strained, broth-based soups. The items listed above may not be a complete list of recommended foods or beverages. Contact your dietitian for more options. What  foods can I not eat? Grains All breads. Grains are not allowed unless they are pureed into soup. Vegetables Vegetables are not allowed unless they are juiced, or cooked and pureed into soup. Fruits Fruits are not allowed unless they are juiced. Meats and Other Protein Sources Any meat or fish. Cooked or raw eggs. Nut butters. Dairy Cheese. Condiments Stone ground mustards. Fats and Oils Fats that are coarse or chunky. Sweets and Desserts Ice cream or other frozen desserts that have any solids in them or on top, such as nuts, chocolate chips, and pieces of cookies. Cakes. Cookies. Candy. Others Soups with chunks or pieces in them. The items listed above may not be a complete list of foods and beverages to avoid. Contact your dietitian for more information. This information is not intended to replace advice given to you by your health care provider. Make sure you discuss any questions you have with your health care provider. Document Released: 07/27/2005 Document Revised: 01/02/2016 Document Reviewed: 06/01/2013 Elsevier Interactive Patient Education  2017 [employer].

## 2018-03-02 NOTE — Telephone Encounter (Signed)
Called patient and she said she is seeing Dr. Cyndy Freeze for her surgery.  Called Dr. Hewitt Shorts office and patient's surgery is scheduled for 03/11/18.  Left a message with Caren Griffins to find out when she would be able to have surgery with Dr. Gerarda Fraction.

## 2018-03-03 ENCOUNTER — Encounter: Payer: Self-pay | Admitting: Oncology

## 2018-03-03 ENCOUNTER — Telehealth: Payer: Self-pay | Admitting: Oncology

## 2018-03-03 NOTE — Telephone Encounter (Signed)
Dr. Cyndy Freeze called and said there are no limitations for Lorraine Lynch from his standpoint for surgery with Dr. Gerarda Fraction.  He said for the surgery on 03/11/18 will have a posterior incision and said she can have surgery with Dr. Gerarda Fraction anytime.

## 2018-03-09 ENCOUNTER — Other Ambulatory Visit: Payer: Self-pay | Admitting: Family

## 2018-03-09 DIAGNOSIS — Z86711 Personal history of pulmonary embolism: Secondary | ICD-10-CM

## 2018-03-09 DIAGNOSIS — I824Y1 Acute embolism and thrombosis of unspecified deep veins of right proximal lower extremity: Secondary | ICD-10-CM

## 2018-03-10 ENCOUNTER — Other Ambulatory Visit: Payer: Self-pay

## 2018-03-10 ENCOUNTER — Inpatient Hospital Stay: Payer: 59 | Attending: Hematology & Oncology

## 2018-03-10 ENCOUNTER — Encounter: Payer: Self-pay | Admitting: Family

## 2018-03-10 ENCOUNTER — Inpatient Hospital Stay (HOSPITAL_BASED_OUTPATIENT_CLINIC_OR_DEPARTMENT_OTHER): Payer: 59 | Admitting: Family

## 2018-03-10 VITALS — BP 120/64 | HR 82 | Temp 98.8°F | Resp 16 | Wt 268.0 lb

## 2018-03-10 DIAGNOSIS — I82819 Embolism and thrombosis of superficial veins of unspecified lower extremities: Secondary | ICD-10-CM | POA: Insufficient documentation

## 2018-03-10 DIAGNOSIS — N946 Dysmenorrhea, unspecified: Secondary | ICD-10-CM | POA: Insufficient documentation

## 2018-03-10 DIAGNOSIS — I824Y1 Acute embolism and thrombosis of unspecified deep veins of right proximal lower extremity: Secondary | ICD-10-CM

## 2018-03-10 DIAGNOSIS — N8189 Other female genital prolapse: Secondary | ICD-10-CM | POA: Insufficient documentation

## 2018-03-10 DIAGNOSIS — Z86711 Personal history of pulmonary embolism: Secondary | ICD-10-CM | POA: Insufficient documentation

## 2018-03-10 DIAGNOSIS — F17210 Nicotine dependence, cigarettes, uncomplicated: Secondary | ICD-10-CM

## 2018-03-10 DIAGNOSIS — Z7901 Long term (current) use of anticoagulants: Secondary | ICD-10-CM

## 2018-03-10 DIAGNOSIS — N83201 Unspecified ovarian cyst, right side: Secondary | ICD-10-CM

## 2018-03-10 LAB — RETICULOCYTES
RBC.: 5.1 MIL/uL (ref 3.70–5.45)
Retic Count, Absolute: 76.5 10*3/uL (ref 33.7–90.7)
Retic Ct Pct: 1.5 % (ref 0.7–2.1)

## 2018-03-10 LAB — COMPREHENSIVE METABOLIC PANEL
ALT: 22 U/L (ref 0–44)
ANION GAP: 11 (ref 5–15)
AST: 19 U/L (ref 15–41)
Albumin: 4.1 g/dL (ref 3.5–5.0)
Alkaline Phosphatase: 62 U/L (ref 38–126)
BUN: 18 mg/dL (ref 6–20)
CALCIUM: 9.6 mg/dL (ref 8.9–10.3)
CHLORIDE: 104 mmol/L (ref 98–111)
CO2: 23 mmol/L (ref 22–32)
CREATININE: 0.76 mg/dL (ref 0.44–1.00)
Glucose, Bld: 91 mg/dL (ref 70–99)
Potassium: 4 mmol/L (ref 3.5–5.1)
SODIUM: 138 mmol/L (ref 135–145)
Total Bilirubin: 0.1 mg/dL — ABNORMAL LOW (ref 0.3–1.2)
Total Protein: 7.7 g/dL (ref 6.5–8.1)

## 2018-03-10 LAB — CBC WITH DIFFERENTIAL (CANCER CENTER ONLY)
BASOS ABS: 0 10*3/uL (ref 0.0–0.1)
Basophils Relative: 0 %
EOS PCT: 1 %
Eosinophils Absolute: 0.2 10*3/uL (ref 0.0–0.5)
HEMATOCRIT: 40.9 % (ref 34.8–46.6)
Hemoglobin: 12.7 g/dL (ref 11.6–15.9)
LYMPHS PCT: 25 %
Lymphs Abs: 3.5 10*3/uL — ABNORMAL HIGH (ref 0.9–3.3)
MCH: 24.6 pg — ABNORMAL LOW (ref 26.0–34.0)
MCHC: 31.1 g/dL — AB (ref 32.0–36.0)
MCV: 79.1 fL — AB (ref 81.0–101.0)
MONO ABS: 1.3 10*3/uL — AB (ref 0.1–0.9)
MONOS PCT: 9 %
NEUTROS ABS: 8.8 10*3/uL — AB (ref 1.5–6.5)
Neutrophils Relative %: 65 %
PLATELETS: 353 10*3/uL (ref 145–400)
RBC: 5.17 MIL/uL (ref 3.70–5.32)
RDW: 16.3 % — AB (ref 11.1–15.7)
WBC Count: 13.8 10*3/uL — ABNORMAL HIGH (ref 3.9–10.0)

## 2018-03-10 LAB — ANTITHROMBIN III: AntiThromb III Func: 118 % (ref 75–120)

## 2018-03-10 LAB — D-DIMER, QUANTITATIVE (NOT AT ARMC): D DIMER QUANT: 0.48 ug{FEU}/mL (ref 0.00–0.50)

## 2018-03-10 NOTE — Progress Notes (Signed)
Hematology/Oncology Consultation   Name: Lorraine Lynch      MRN: [id number]    Location: Room/bed info not found  Date: 03/10/2018 Time:1:35 PM   REFERRING PHYSICIAN: Lynne Leader, MD  REASON FOR CONSULT: DVT in pregnancy   DIAGNOSIS:  History of PE after pregnancy  Occlusive superficial thrombophlebitis of the right lesser saphenous vein  HISTORY OF PRESENT ILLNESS: Lorraine Lynch is a very pleasant 37 yo caucasian female with history of PE in the right lower lobe 2 weeks after giving birth 3 1/2 years ago. She was treated with 6 months of Coumadin and states the PE resolved with treatment.  She now has an occlusive superficial thrombophlebitis of the right lesser saphenous vein. She is doing well on Eliquis and has had no problems. The swelling in her right leg has improved.  She states that she will be having spinal surgery tomorrow with Dr. Raliegh Ip. Cabbell to remove a bulging disc. She has sciatica and pain down the back of her right leg due to this. She has been more sedentary due to the discomfort.  She has held her Eliquis since Monday and will restart the day after at 5 mg PO daily for 4 days and then resume 5 mg PO BID  She states that her mother told her that her paternal grandmother had history of blood clots.  She has 2 children carried full term and one miscarriage at 9 weeks in between. She stopped her birth control 4 years and never restarted. She verbalized understanding that she is unable to use any contraception or hormone replacement containing estrogen.  She also has has right ovarian cyst that needs to be removed. She states she may end up needing a hysterectomy.  She states that her cycles are regular but heavy, painful and has clots. No other bleeding, no bruising or petechiae.  She had an appendectomy in 2010 and breast reduction in 2001 without any complications.  She does smoke < 1/2 ppd. No ETOH.  She has sleep apnea and sleeps with a CPAP nightly.  No issue with infection. No  fever, chills, n/v, cough, rash, dizziness, SOB, chest pain, palpitations, abdominal pain or changes in bowel or bladder habits.  No lymphadenopathy noted on exam.  She has a good appetite and is staying well hydrated. Her weight is stable.  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   ROS: All other 10 point review of systems is negative.   PAST MEDICAL HISTORY:   Past Medical History:  Diagnosis Date  . Anemia   . DVT (deep venous thrombosis) (HCC)    Right Leg  . High blood pressure   . History of pulmonary embolism 10/14/2017    ALLERGIES: Allergies  Allergen Reactions  . Latex Hives, Itching and Rash    Burning   . Tuberculin Ppd Dermatitis and Rash      MEDICATIONS:  Current Outpatient Medications on File Prior to Visit  Medication Sig Dispense Refill  . apixaban (ELIQUIS) 5 MG TABS tablet Take 2 tablets (10 mg total) by mouth 2 (two) times daily for 7 days, THEN 1 tablet (5 mg total) 2 (two) times daily for 23 days. 74 tablet 0  . gabapentin (NEURONTIN) 300 MG capsule One tab PO qHS for a week, then BID for a week, then TID. May double weekly to a max of 3,600mg /day (Patient taking differently: as needed. One tab PO qHS for a week, then BID for a week,  then TID. May double weekly to a max of 3,600mg /day) 180 capsule 3  . ibuprofen (ADVIL,MOTRIN) 200 MG tablet Take 200 mg by mouth every 6 (six) hours as needed.    Marland Kitchen lisinopril (PRINIVIL,ZESTRIL) 20 MG tablet Take 1 tablet (20 mg total) by mouth daily. 90 tablet 3   No current facility-administered medications on file prior to visit.      PAST SURGICAL HISTORY Past Surgical History:  Procedure Laterality Date  . APPENDECTOMY    . BREAST REDUCTION SURGERY  05/2000  . OTHER SURGICAL HISTORY  02/08/2018   Epidural steriod injection    FAMILY HISTORY: Family History  Problem Relation Age of Onset  . Alcoholism Mother   . Heart attack Mother   . Diabetes Mother   . High Cholesterol  Mother   . High blood pressure Mother   . Non-Hodgkin's lymphoma Maternal Uncle     SOCIAL HISTORY:  reports that she has been smoking cigarettes.  She has been smoking about 0.50 packs per day. She has never used smokeless tobacco. She reports that she does not drink alcohol or use drugs.  PERFORMANCE STATUS: The patient's performance status is 1 - Symptomatic but completely ambulatory  PHYSICAL EXAM: Most Recent Vital Signs: Last menstrual period 02/19/2018. BP 120/64 (BP Location: Left Arm, Patient Position: Sitting)   Pulse 82   Temp 98.8 F (37.1 C) (Oral)   Resp 16   Wt 268 lb (121.6 kg)   LMP 02/19/2018   SpO2 99%   BMI 43.26 kg/m   General Appearance:    Alert, cooperative, no distress, appears stated age  Head:    Normocephalic, without obvious abnormality, atraumatic  Eyes:    PERRL, conjunctiva/corneas clear, EOM's intact, fundi    benign, both eyes        Throat:   Lips, mucosa, and tongue normal; teeth and gums normal  Neck:   Supple, symmetrical, trachea midline, no adenopathy;    thyroid:  no enlargement/tenderness/nodules; no carotid   bruit or JVD  Back:     Symmetric, no curvature, ROM normal, no CVA tenderness  Lungs:     Clear to auscultation bilaterally, respirations unlabored  Chest Wall:    No tenderness or deformity   Heart:    Regular rate and rhythm, S1 and S2 normal, no murmur, rub   or gallop     Abdomen:     Soft, non-tender, bowel sounds active all four quadrants,    no masses, no organomegaly        Extremities:   Extremities normal, atraumatic, no cyanosis or edema  Pulses:   2+ and symmetric all extremities  Skin:   Skin color, texture, turgor normal, no rashes or lesions  Lymph nodes:   Cervical, supraclavicular, and axillary nodes normal  Neurologic:   CNII-XII intact, normal strength, sensation and reflexes    throughout    LABORATORY DATA:  No results found for this or any previous visit (from the past 48 hour(s)).     RADIOGRAPHY: No results found.     PATHOLOGY: None   ASSESSMENT/PLAN: Lorraine Lynch is a very pleasant 37 yo caucasian female with history of PE in the right lower lobe 2 weeks after giving birth 3 1/2 years ago (treated with 6 months of Coumadin) and has now been diagnosed with an occlusive superficial thrombophlebitis of the right lesser saphenous vein. She started treatment with Eliquis 2 weeks ago. This is currently on hold for back surgery tomorrow and will resume the  day after at half once a day for 4 days and then back to BID. She verbalized understanding.  We will see what all her lab work shows. We will plan to see her back in another 2 months for follow-up with repeat US that same day.   All questions were answered and she is in agreement with the plan. She will contact our office with any questions or concerns. We can certainly see her sooner if need be.   She was discussed with and also seen by Dr. Marin Olp and he is in agreement with the aforementioned.   Laverna Peace     Addendum:   I saw and examined Lorraine Lynch with Judson Roch.  I agree with her above assessment.  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  She was quite young when she had the first thromboembolic event.  It was after pregnancy.  She has had a miscarriage.  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  We sent off a hypercoagulable panel on her to see if we can find anything that might have caused the recurrent thromboembolic event.  She is on Eliquis.  She is going for back surgery tomorrow.  She is off the Eliquis right now.  I told her to restart the Eliquis the day after surgery at 1 pill a day (5 mg) and then take this for 4 days before going back up to 2 pills a day of Eliquis.  I really would hate to commit her to lifelong anticoagulation.  However, we might need to depend on what we find with her hypercoagulable panel.  I would at least have her on 1 year of  full dose anticoagulation.  After that, we may continue full dose anticoagulation or go down to maintenance anticoagulation at 2.5 mg p.o. twice daily.  She does smoke.  She smokes about half a pack a day.  I know this is not going to help her situation with blood clotting.  Hopefully she will stop smoking.  We will see her back in about 8 weeks.  I want to make sure when she comes back it will not be too difficult for her with her back surgery.  We see her back, we will get a Doppler of her right leg to see how the thromboembolic event is resolving.  We also encouraged her to get a stocking for her right leg so that postphlebitic syndrome does not develop.  We will give her the name and phone number of the company down in [place] I does a good job and measuring and fitting compression stockings.  We spent about 40 minutes with Lorraine Lynch.  All the time spent face-to-face.  We went ahead and counseled her about her blood clots.  We coordinated future care for her.  We answered all of her questions.  Lattie Haw, MD

## 2018-03-11 DIAGNOSIS — M5127 Other intervertebral disc displacement, lumbosacral region: Secondary | ICD-10-CM | POA: Diagnosis not present

## 2018-03-11 LAB — IRON AND TIBC
Iron: 41 ug/dL (ref 41–142)
Saturation Ratios: 10 % — ABNORMAL LOW (ref 21–57)
TIBC: 399 ug/dL (ref 236–444)
UIBC: 357 ug/dL

## 2018-03-11 LAB — COMPREHENSIVE METABOLIC PANEL
ALT: 22 U/L (ref 0–44)
ANION GAP: 11 (ref 5–15)
AST: 19 U/L (ref 15–41)
Albumin: 4.1 g/dL (ref 3.5–5.0)
Alkaline Phosphatase: 62 U/L (ref 38–126)
BILIRUBIN TOTAL: 0.1 mg/dL — AB (ref 0.3–1.2)
BUN: 18 mg/dL (ref 6–20)
CO2: 23 mmol/L (ref 22–32)
Calcium: 9.6 mg/dL (ref 8.9–10.3)
Chloride: 104 mmol/L (ref 98–111)
Creatinine, Ser: 0.76 mg/dL (ref 0.44–1.00)
Glucose, Bld: 91 mg/dL (ref 70–99)
POTASSIUM: 4 mmol/L (ref 3.5–5.1)
Sodium: 138 mmol/L (ref 135–145)
TOTAL PROTEIN: 7.7 g/dL (ref 6.5–8.1)

## 2018-03-11 LAB — PROTEIN C ACTIVITY: PROTEIN C ACTIVITY: 142 % (ref 73–180)

## 2018-03-11 LAB — LACTATE DEHYDROGENASE: LDH: 209 U/L — ABNORMAL HIGH (ref 98–192)

## 2018-03-11 LAB — BETA-2-GLYCOPROTEIN I ABS, IGG/M/A: Beta-2 Glyco I IgG: <9 GPI IgG units (ref 0–20)

## 2018-03-11 LAB — PROTEIN S ACTIVITY: Protein S Activity: 90 % (ref 63–140)

## 2018-03-11 LAB — FERRITIN: FERRITIN: 28 ng/mL (ref 11–307)

## 2018-03-11 LAB — PROTEIN C, TOTAL: PROTEIN C, TOTAL: 121 % (ref 60–150)

## 2018-03-11 LAB — PROTEIN S, TOTAL: Protein S Ag, Total: 96 % (ref 60–150)

## 2018-03-11 LAB — LUPUS ANTICOAGULANT PANEL
DRVVT: 30 s (ref 0.0–47.0)
PTT Lupus Anticoagulant: 27.7 s (ref 0.0–51.9)

## 2018-03-11 LAB — HOMOCYSTEINE: Homocysteine: 13.5 umol/L (ref 0.0–15.0)

## 2018-03-12 LAB — CARDIOLIPIN ANTIBODIES, IGG, IGM, IGA
Anticardiolipin IgA: <9 APL U/mL (ref 0–11)
Anticardiolipin IgM: 13 MPL U/mL — ABNORMAL HIGH (ref 0–12)

## 2018-03-15 LAB — PROTHROMBIN GENE MUTATION

## 2018-03-16 ENCOUNTER — Other Ambulatory Visit: Payer: Self-pay | Admitting: Family Medicine

## 2018-03-16 ENCOUNTER — Encounter: Payer: Self-pay | Admitting: Obstetrics

## 2018-03-16 LAB — FACTOR 5 LEIDEN

## 2018-03-17 ENCOUNTER — Telehealth: Payer: Self-pay | Admitting: Family

## 2018-03-17 DIAGNOSIS — D508 Other iron deficiency anemias: Secondary | ICD-10-CM

## 2018-03-17 MED ORDER — APIXABAN 5 MG PO TABS: 5.0000 mg | ORAL_TABLET | Freq: Two times a day (BID) | ORAL | 6 refills | Status: DC

## 2018-03-17 NOTE — Telephone Encounter (Signed)
I spoke with Lorraine Lynch and went over her lab work with her in detail. 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  She was also iron deficiency and will try taking gentle iron daily. We will recheck her labs at follow-up. All questions answered and we will see her back on 10/1.

## 2018-03-22 ENCOUNTER — Telehealth: Payer: Self-pay | Admitting: Oncology

## 2018-03-22 NOTE — Telephone Encounter (Signed)
Called Lorraine Lynch to see if she would like to come in on Thursday, 03/24/18 at 12:30 to see Dr. Gerarda Fraction to discuss pre-op/surgery scheduling.   Letica said she has a meeting at her daughter's school at 84 so would be available at 2 pm.  Appointment scheduled for 2 pm on 03/24/18 with Dr. Gerarda Fraction.

## 2018-03-23 ENCOUNTER — Encounter: Payer: Self-pay | Admitting: Family Medicine

## 2018-03-24 ENCOUNTER — Telehealth: Payer: Self-pay | Admitting: Oncology

## 2018-03-24 ENCOUNTER — Encounter: Payer: Self-pay | Admitting: Obstetrics

## 2018-03-24 ENCOUNTER — Inpatient Hospital Stay (HOSPITAL_BASED_OUTPATIENT_CLINIC_OR_DEPARTMENT_OTHER): Payer: 59 | Admitting: Obstetrics

## 2018-03-24 VITALS — BP 124/73 | HR 107 | Temp 98.8°F | Resp 18 | Ht 66.0 in | Wt 271.0 lb

## 2018-03-24 DIAGNOSIS — N83201 Unspecified ovarian cyst, right side: Secondary | ICD-10-CM

## 2018-03-24 DIAGNOSIS — I82819 Embolism and thrombosis of superficial veins of unspecified lower extremities: Secondary | ICD-10-CM | POA: Diagnosis not present

## 2018-03-24 DIAGNOSIS — Z7901 Long term (current) use of anticoagulants: Secondary | ICD-10-CM | POA: Diagnosis not present

## 2018-03-24 DIAGNOSIS — N946 Dysmenorrhea, unspecified: Secondary | ICD-10-CM | POA: Diagnosis not present

## 2018-03-24 DIAGNOSIS — F17210 Nicotine dependence, cigarettes, uncomplicated: Secondary | ICD-10-CM | POA: Diagnosis not present

## 2018-03-24 DIAGNOSIS — Z86711 Personal history of pulmonary embolism: Secondary | ICD-10-CM | POA: Diagnosis not present

## 2018-03-24 DIAGNOSIS — N8189 Other female genital prolapse: Secondary | ICD-10-CM | POA: Diagnosis not present

## 2018-03-24 MED ORDER — GABAPENTIN 300 MG PO CAPS: 600.0000 mg | ORAL_CAPSULE | Freq: Three times a day (TID) | ORAL | 1 refills | Status: DC

## 2018-03-24 MED FILL — GABAPENTIN 300 MG CAPSULE: 300 | 90 days supply | Qty: 540 | Fill #0

## 2018-03-24 NOTE — Telephone Encounter (Signed)
[facility] in [place] and requested a copy of patient's pap from 2018.  They said they will fax it to [phone].

## 2018-03-24 NOTE — H&P (View-Only) (Signed)
Lorraine Lynch at [facility]   Progress Note: Established Patient Follow-Up Visit   Consult was originally requested by Dr. Clovia Cuff for a large adnexal mass.   Chief Complaint  Patient presents with  . Cyst of right ovary    HPI: [name]  is a very nice 37 y.o.  P4  She had noted some back pain and visited with a Family and Sports Medicine doctor (Dr. Lynne Leader). Imaging was ordered - 01/31/18 MRI revealed a large right paramedian disc protrusion at L5-S1 with severe right subarticular stenosis, likely impacting the right S1 nerve roots. 2. Moderate foraminal stenosis bilaterally at L5-S1 secondary to disc disease and facet spurring, right greater than left. 3. No significant disease at L4-5 or above. 4. Moderate distention of the urinary bladder could be neurogenic. Besides the worrisome orthopedic findings Dr. Georgina Snell thought the distended bladder was odd and ordered followup imaging with a TVUS - 02/01/18 - TVUS showed  Right ovary 13.6 x 9.9 x 18.5 cm. Right ovary contains a large anechoic minimally complex cyst 12.8 x 13.4 x 12.1 cm. Finally in addition to the above issues she had a LE doppler ordered 7/10 due to complaints of LE edema/pain that revealed IMPRESSION: 1. No evidence of acute or chronic DVT within either lower extremity. 2. Examination is positive for occlusive superficial thrombophlebitis involving right lesser saphenous vein, again, without extension to the deep venous system of the right lower extremity.  Hulan Fray with Gyn saw the patient and ordered tumor marker CA125 02/23/18 = 28. Given the size of the lesion she was referred for recommendations regarding the adnexal mass.  Her PCP also referred her to neurosurgery for the spinal stenosis and underwent surgery with no issues. She is cleared for laying on her back for our surgery.  She was referred to and seen now by Heme-Onc for the occlusive superficial thrombophlebitis  and remains on Eliquis (NOTE she has a PMH of pulmonary embolus) with recommendations to continue with perioperative hold. 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  She has noted some RLQ pain and intermittent vomiting since Nov 2018. No nausea however. Denies changes in bowels, denies weight loss.  Does have a h/o pelvic "prolapse" and dysmenorrhea with menses cyclic X79+/- days. She had a progesterone IUD at one point but said it was painful.  She returns today to finalize a plan for the adnexal mass and perioperative planning.   Imported EPIC Oncologic History: TBD   No history exists.   Measurement of disease:  Recent Labs    02/23/18 1049  CA125 28    . Normal preop  Radiology: 02/01/18 - TVUS - Uterus - 8.5 x 5.4 x 5.7 cm. No fibroids or other mass visualized. Endometrium Thickness: 8.9 mm.  No focal abnormality visualized. Right ovary 13.6 x 9.9 x 18.5 cm. Right ovary contains a large anechoic minimally complex cyst 12.8 x 13.4 x 12.1 cm. Slight peripheral wall thickening/nodularity. This correlates with the lumbar spine MRI finding. Left ovary 3.5 x 2.3 x 3.5 cm. Normal appearance/no adnexal mass. Pulsed Doppler evaluation of both ovaries demonstrates normal low-resistance arterial and venous waveforms. Other findings Trace pelvic free fluid IMPRESSION: 12.8 cm right ovarian minimally complex large ovarian cyst correlates with the lumbar spine MRI finding. No results found.    Outpatient Encounter Medications as of 03/24/2018  Medication Sig  . apixaban (ELIQUIS) 5 MG TABS tablet Take  1 tablet (5 mg total) by mouth 2 (two) times daily.  Marland Kitchen gabapentin (NEURONTIN) 300 MG capsule Take 2 capsules (600 mg total) by mouth 3 (three) times daily.  Marland Kitchen lisinopril (PRINIVIL,ZESTRIL) 20 MG tablet Take 1 tablet (20 mg total) by mouth daily.   No facility-administered encounter medications on file as of 03/24/2018.    Allergies  Allergen Reactions  .  Latex Hives, Itching and Rash    Burning   . Tuberculin Ppd Dermatitis and Rash    Past Medical History:  Diagnosis Date  . Anemia   . High blood pressure   . History of pulmonary embolism 10/14/2017  . Prothrombin gene mutation ([place])   . Sleep apnea    uses CPAP  . Spinal stenosis   . Thrombophlebitis of leg, right, superficial 02/2018   occlusive   Past Surgical History:  Procedure Laterality Date  . APPENDECTOMY  2010   laparoscopic not ruptured  . BACK SURGERY  2019  . BREAST REDUCTION SURGERY  05/2000  . OTHER SURGICAL HISTORY  02/08/2018   Epidural steriod injection      Past Gynecological History:   GYNECOLOGIC HISTORY:  No LMP recorded. Menarche: 37 years old P 4 Contraceptive  5 years OCP HRT none  Last Pap 10/2017 negative (no HPV done) Family Hx:  Family History  Problem Relation Age of Onset  . Alcoholism Mother   . Heart attack Mother   . Diabetes Mother   . High Cholesterol Mother   . High blood pressure Mother   . Non-Hodgkin's lymphoma Maternal Uncle 103   Social Hx:  Tobacco use: current daily Alcohol use: 1/ week Illicit Drug use: none Illicit IV Drug use: none    Review of Systems: Review of Systems  Musculoskeletal: Positive for back pain.  All other systems reviewed and are negative. Back pain improved from first visit, though.   Vitals:  Last menstrual period 02/19/2018. Vitals:   03/24/18 1426  Weight: 271 lb (122.9 kg)  Height: 5\' 6"  (1.676 m)    Body mass index is 43.74 kg/m.   Physical Exam: From 03/02/18 General :  Obese, well developed, 37 y.o., female in no apparent distress HEENT:  Normocephalic/atraumatic, symmetric, EOMI, eyelids normal Neck:   Supple, no masses.  Lymphatics:  No cervical/ submandibular/ supraclavicular/ infraclavicular/ inguinal adenopathy Respiratory:  Respirations unlabored, no use of accessory muscles CV:   Deferred Breast:  Deferred Musculoskeletal: No CVA tenderness, normal muscle  strength. Abdomen:  Obese with small pannus. Soft, non-tender and nondistended. No evidence of hernia. ? Mass, difficult to discern given habitus Extremities:  No lymphedema, no erythema, non-tender. Skin:   Normal inspection Neuro/Psych:  No focal motor deficit, no abnormal mental status. Normal gait. Normal affect. Alert and oriented to person, place, and time  Genito Urinary: Vulva: Normal external female genitalia.  Bladder/urethra: Urethral meatus normal in size and location. No lesions or   masses, bladder with slight relaxation anterior vaginal wall Speculum exam: Vagina: +rectocele. No lesion, no discharge, no bleeding. Cervix: Difficult to visualize given relaxation , palpates normal Bimanual exam:  Uterus: mobile. Unable to palpate fundus due to habitus  Adnexa: No masses. Rectovaginal:  Good tone, no masses, no cul de sac nodularity, no parametrial involvement or nodularity.   Assessment  Right ovarian cyst Dysmenorrhea Pelvic relaxation Back pain with spinal stenosis H/o PE and Current superficial thrombophlebitis on Eliquis  Plan  1. Rectocele and dysmenorrhea o If we don't end up doing a hysterectomy/BSO then will defer  to Dr. Hulan Fray o She does splint with bowel movements so this may require surgical management by Dr. Hulan Fray or urogyn. We discussed this today and can readdress on future visit. 2. Back pain with spinal stenosis o Improved since surgery o She is now cleared 3. H/o PE and now occlusive superficial thrombophlebitis o Heme-onc recommendations are to continue Eliquis i. We will hold perioperatively and give periop prophylactic-dose Lovenox during the hold. o She reports she has prothrombin gene mutation 4. Previously we reviewed the normal tumor marker and the reassurance that can provide in conjunction with the ultrasound images 5. Decision for surgery o Although I favor a benign process the mass is large (making the adnexa measure 13x18cm) and I do have  concern she may have had and may still risk torsion at some point given her prior emesis/pain episode. o Surgery will provide pathology and hopefully reassurance. 6. Treatment planning o The lesion is too large for laparoscopic approach thus I have recommended an open oophorectomy and bilateral salpingectomy (since she has completed childbearing), possible TAH/BSO/Staging ? We previously reviewed the risks/benefits/alternatives of the surgery and the surgical sketch. She was given a copy of the sketch last visit.  Face to face time with patient was 15 minutes. Over 50% of this time was spent on counseling and coordination of care.    Isabel Caprice, MD  03/27/2018, 2:50 PM   Cc: Clovia Cuff, MD (Referring Ob/Gyn) Emeterio Reeve, DO (PCP)

## 2018-03-24 NOTE — Patient Instructions (Signed)
Preparing for your Surgery  Plan for surgery on April 07, 2018 with Dr. Everitt Amber and Dr. Precious Haws as assist at [place] will be scheduled for an exploratory laparotomy, unilateral oophorectomy, bilateral salpingectomy, possible total abdominal hysterectomy, possible staging.  PLAN TO HOLD THE ELIQUIS FOR 5 DAYS BEFORE SURGERY PER DR. ROSSI'S RECOMMENDATIONS.  Pre-operative Testing -You will receive a phone call from presurgical testing at [facility] to arrange for a pre-operative testing appointment before your surgery.  This appointment normally occurs one to two weeks before your scheduled surgery.   -Bring your insurance card, copy of an advanced directive if applicable, medication list  -At that visit, you will be asked to sign a consent for a possible blood transfusion in case a transfusion becomes necessary during surgery.  The need for a blood transfusion is rare but having consent is a necessary part of your care.     -You should not be taking blood thinners or aspirin at least ten days prior to surgery unless instructed by your surgeon.  Day Before Surgery at [place] will be asked to take in a light diet the day before surgery.  Avoid carbonated beverages.  You will be advised to have nothing to eat or drink after midnight the evening before.    Eat a light diet the day before surgery.  Examples including soups, broths, toast, yogurt, mashed potatoes.  Things to avoid include carbonated beverages (fizzy beverages), raw fruits and raw vegetables, or beans.   If your bowels are filled with gas, your surgeon will have difficulty visualizing your pelvic organs which increases your surgical risks.  Your role in recovery Your role is to become active as soon as directed by your doctor, while still giving yourself time to heal.  Rest when you feel tired. You will be asked to do the following in order to speed your recovery:  - Cough and  breathe deeply. This helps toclear and expand your lungs and can prevent pneumonia. You may be given a spirometer to practice deep breathing. A staff member will show you how to use the spirometer. - Do mild physical activity. Walking or moving your legs help your circulation and body functions return to normal. A staff member will help you when you try to walk and will provide you with simple exercises. Do not try to get up or walk alone the first time. - Actively manage your pain. Managing your pain lets you move in comfort. We will ask you to rate your pain on a scale of zero to 10. It is your responsibility to tell your doctor or nurse where and how much you hurt so your pain can be treated.  Special Considerations -If you are diabetic, you may be placed on insulin after surgery to have closer control over your blood sugars to promote healing and recovery.  This does not mean that you will be discharged on insulin.  If applicable, your oral antidiabetics will be resumed when you are tolerating a solid diet.  -Your final pathology results from surgery should be available by the Friday after surgery and the results will be relayed to you when available.  -Dr. Lahoma Crocker is one of the surgeons that assists your GYN Oncologist with surgery.  The next day after your surgery you will either see your GYN Oncologist or Dr. Lahoma Crocker.   Blood Transfusion Information WHAT IS A BLOOD TRANSFUSION? A transfusion is the replacement of blood or some  of its parts. Blood is made up of multiple cells which provide different functions.  Red blood cells carry oxygen and are used for blood loss replacement.  White blood cells fight against infection.  Platelets control bleeding.  Plasma helps clot blood.  Other blood products are available for specialized needs, such as hemophilia or other clotting disorders. BEFORE THE TRANSFUSION  Who gives blood for transfusions?   You may be able to  donate blood to be used at a later date on yourself (autologous donation).  Relatives can be asked to donate blood. This is generally not any safer than if you have received blood from a stranger. The same precautions are taken to ensure safety when a relative's blood is donated.  Healthy volunteers who are fully evaluated to make sure their blood is safe. This is blood bank blood. Transfusion therapy is the safest it has ever been in the practice of medicine. Before blood is taken from a donor, a complete history is taken to make sure that person has no history of diseases nor engages in risky social behavior (examples are intravenous drug use or sexual activity with multiple partners). The donor's travel history is screened to minimize risk of transmitting infections, such as malaria. The donated blood is tested for signs of infectious diseases, such as HIV and hepatitis. The blood is then tested to be sure it is compatible with you in order to minimize the chance of a transfusion reaction. If you or a relative donates blood, this is often done in anticipation of surgery and is not appropriate for emergency situations. It takes many days to process the donated blood. RISKS AND COMPLICATIONS Although transfusion therapy is very safe and saves many lives, the main dangers of transfusion include:   Getting an infectious disease.  Developing a transfusion reaction. This is an allergic reaction to something in the blood you were given. Every precaution is taken to prevent this. The decision to have a blood transfusion has been considered carefully by your caregiver before blood is given. Blood is not given unless the benefits outweigh the risks.

## 2018-03-24 NOTE — Progress Notes (Signed)
[place] at [facility]   Progress Note: Established Patient Follow-Up Visit   Consult was originally requested by Dr. Clovia Cuff for a large adnexal mass.   Chief Complaint  Patient presents with  . Cyst of right ovary    HPI: [name]  is a very nice 37 y.o.  P4  She had noted some back pain and visited with a Family and Sports Medicine doctor (Dr. Lynne Leader). Imaging was ordered - 01/31/18 MRI revealed a large right paramedian disc protrusion at L5-S1 with severe right subarticular stenosis, likely impacting the right S1 nerve roots. 2. Moderate foraminal stenosis bilaterally at L5-S1 secondary to disc disease and facet spurring, right greater than left. 3. No significant disease at L4-5 or above. 4. Moderate distention of the urinary bladder could be neurogenic. Besides the worrisome orthopedic findings Dr. Georgina Snell thought the distended bladder was odd and ordered followup imaging with a TVUS - 02/01/18 - TVUS showed  Right ovary 13.6 x 9.9 x 18.5 cm. Right ovary contains a large anechoic minimally complex cyst 12.8 x 13.4 x 12.1 cm. Finally in addition to the above issues she had a LE doppler ordered 7/10 due to complaints of LE edema/pain that revealed IMPRESSION: 1. No evidence of acute or chronic DVT within either lower extremity. 2. Examination is positive for occlusive superficial thrombophlebitis involving right lesser saphenous vein, again, without extension to the deep venous system of the right lower extremity.  Hulan Fray with Gyn saw the patient and ordered tumor marker CA125 02/23/18 = 28. Given the size of the lesion she was referred for recommendations regarding the adnexal mass.  Her PCP also referred her to neurosurgery for the spinal stenosis and underwent surgery with no issues. She is cleared for laying on her back for our surgery.  She was referred to and seen now by Heme-Onc for the occlusive superficial thrombophlebitis  and remains on Eliquis (NOTE she has a PMH of pulmonary embolus) with recommendations to continue with perioperative hold. 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  She has noted some RLQ pain and intermittent vomiting since Nov 2018. No nausea however. Denies changes in bowels, denies weight loss.  Does have a h/o pelvic "prolapse" and dysmenorrhea with menses cyclic E93+/- days. She had a progesterone IUD at one point but said it was painful.  She returns today to finalize a plan for the adnexal mass and perioperative planning.   Imported EPIC Oncologic History: TBD   No history exists.   Measurement of disease:  Recent Labs    02/23/18 1049  CA125 28    . Normal preop  Radiology: 02/01/18 - TVUS - Uterus - 8.5 x 5.4 x 5.7 cm. No fibroids or other mass visualized. Endometrium Thickness: 8.9 mm.  No focal abnormality visualized. Right ovary 13.6 x 9.9 x 18.5 cm. Right ovary contains a large anechoic minimally complex cyst 12.8 x 13.4 x 12.1 cm. Slight peripheral wall thickening/nodularity. This correlates with the lumbar spine MRI finding. Left ovary 3.5 x 2.3 x 3.5 cm. Normal appearance/no adnexal mass. Pulsed Doppler evaluation of both ovaries demonstrates normal low-resistance arterial and venous waveforms. Other findings Trace pelvic free fluid IMPRESSION: 12.8 cm right ovarian minimally complex large ovarian cyst correlates with the lumbar spine MRI finding. No results found.    Outpatient Encounter Medications as of 03/24/2018  Medication Sig  . apixaban (ELIQUIS) 5 MG TABS tablet Take  1 tablet (5 mg total) by mouth 2 (two) times daily.  Marland Kitchen gabapentin (NEURONTIN) 300 MG capsule Take 2 capsules (600 mg total) by mouth 3 (three) times daily.  Marland Kitchen lisinopril (PRINIVIL,ZESTRIL) 20 MG tablet Take 1 tablet (20 mg total) by mouth daily.   No facility-administered encounter medications on file as of 03/24/2018.    Allergies  Allergen Reactions  .  Latex Hives, Itching and Rash    Burning   . Tuberculin Ppd Dermatitis and Rash    Past Medical History:  Diagnosis Date  . Anemia   . High blood pressure   . History of pulmonary embolism 10/14/2017  . Prothrombin gene mutation ([place])   . Sleep apnea    uses CPAP  . Spinal stenosis   . Thrombophlebitis of leg, right, superficial 02/2018   occlusive   Past Surgical History:  Procedure Laterality Date  . APPENDECTOMY  2010   laparoscopic not ruptured  . BACK SURGERY  2019  . BREAST REDUCTION SURGERY  05/2000  . OTHER SURGICAL HISTORY  02/08/2018   Epidural steriod injection      Past Gynecological History:   GYNECOLOGIC HISTORY:  No LMP recorded. Menarche: 37 years old P 4 Contraceptive  5 years OCP HRT none  Last Pap 10/2017 negative (no HPV done) Family Hx:  Family History  Problem Relation Age of Onset  . Alcoholism Mother   . Heart attack Mother   . Diabetes Mother   . High Cholesterol Mother   . High blood pressure Mother   . Non-Hodgkin's lymphoma Maternal Uncle 69   Social Hx:  Tobacco use: current daily Alcohol use: 1/ week Illicit Drug use: none Illicit IV Drug use: none    Review of Systems: Review of Systems  Musculoskeletal: Positive for back pain.  All other systems reviewed and are negative. Back pain improved from first visit, though.   Vitals:  Last menstrual period 02/19/2018. Vitals:   03/24/18 1426  Weight: 271 lb (122.9 kg)  Height: 5\' 6"  (1.676 m)    Body mass index is 43.74 kg/m.   Physical Exam: From 03/02/18 General :  Obese, well developed, 37 y.o., female in no apparent distress HEENT:  Normocephalic/atraumatic, symmetric, EOMI, eyelids normal Neck:   Supple, no masses.  Lymphatics:  No cervical/ submandibular/ supraclavicular/ infraclavicular/ inguinal adenopathy Respiratory:  Respirations unlabored, no use of accessory muscles CV:   Deferred Breast:  Deferred Musculoskeletal: No CVA tenderness, normal muscle  strength. Abdomen:  Obese with small pannus. Soft, non-tender and nondistended. No evidence of hernia. ? Mass, difficult to discern given habitus Extremities:  No lymphedema, no erythema, non-tender. Skin:   Normal inspection Neuro/Psych:  No focal motor deficit, no abnormal mental status. Normal gait. Normal affect. Alert and oriented to person, place, and time  Genito Urinary: Vulva: Normal external female genitalia.  Bladder/urethra: Urethral meatus normal in size and location. No lesions or   masses, bladder with slight relaxation anterior vaginal wall Speculum exam: Vagina: +rectocele. No lesion, no discharge, no bleeding. Cervix: Difficult to visualize given relaxation , palpates normal Bimanual exam:  Uterus: mobile. Unable to palpate fundus due to habitus  Adnexa: No masses. Rectovaginal:  Good tone, no masses, no cul de sac nodularity, no parametrial involvement or nodularity.   Assessment  Right ovarian cyst Dysmenorrhea Pelvic relaxation Back pain with spinal stenosis H/o PE and Current superficial thrombophlebitis on Eliquis  Plan  1. Rectocele and dysmenorrhea o If we don't end up doing a hysterectomy/BSO then will defer  to Dr. Hulan Fray o She does splint with bowel movements so this may require surgical management by Dr. Hulan Fray or urogyn. We discussed this today and can readdress on future visit. 2. Back pain with spinal stenosis o Improved since surgery o She is now cleared 3. H/o PE and now occlusive superficial thrombophlebitis o Heme-onc recommendations are to continue Eliquis i. We will hold perioperatively and give periop prophylactic-dose Lovenox during the hold. o She reports she has prothrombin gene mutation 4. Previously we reviewed the normal tumor marker and the reassurance that can provide in conjunction with the ultrasound images 5. Decision for surgery o Although I favor a benign process the mass is large (making the adnexa measure 13x18cm) and I do have  concern she may have had and may still risk torsion at some point given her prior emesis/pain episode. o Surgery will provide pathology and hopefully reassurance. 6. Treatment planning o The lesion is too large for laparoscopic approach thus I have recommended an open oophorectomy and bilateral salpingectomy (since she has completed childbearing), possible TAH/BSO/Staging ? We previously reviewed the risks/benefits/alternatives of the surgery and the surgical sketch. She was given a copy of the sketch last visit.  Face to face time with patient was 15 minutes. Over 50% of this time was spent on counseling and coordination of care.    Isabel Caprice, MD  03/27/2018, 2:50 PM   Cc: Clovia Cuff, MD (Referring Ob/Gyn) Emeterio Reeve, DO (PCP)

## 2018-03-25 ENCOUNTER — Encounter: Payer: Self-pay | Admitting: Obstetrics

## 2018-03-25 ENCOUNTER — Other Ambulatory Visit: Payer: 59

## 2018-03-25 ENCOUNTER — Ambulatory Visit: Payer: 59 | Admitting: Family

## 2018-03-27 ENCOUNTER — Encounter: Payer: Self-pay | Admitting: Obstetrics

## 2018-03-28 NOTE — Patient Instructions (Signed)
Lorraine Lynch  03/28/2018   Your procedure is scheduled on: 04-07-18  Report to [facility] Main  Entrance  Report to admitting at        0730 AM    Call this number if you have problems the morning of surgery [phone]    Remember: EAT A LIGHT DIET THE DAY BEFORE SURGERY EXAMPLES ARE  TOAST YOGURT MASHED POTATOES AND SOUPS . AVOID; RAW FRUITS AND VEGGIES CARBONATED BEVERAGES AND BEANS  NO SOLID FOOD AFTER MIDNIGHT THE NIGHT PRIOR TO SURGERY. NOTHING BY MOUTH EXCEPT CLEAR LIQUIDS UNTIL 3 HOURS PRIOR TO [place] SURGERY. PLEASE FINISH ENSURE DRINK PER SURGEON ORDER 3 HOURS PRIOR TO SCHEDULED SURGERY TIME WHICH NEEDS TO BE COMPLETED AT ___0700 AM_________.   Take these medicines the morning of surgery with A SIP OF WATER: gabapentin                                You may not have any metal on your body including hair pins and              piercings  Do not wear jewelry, make-up, lotions, powders or perfumes, deodorant             Do not wear nail polish.  Do not shave  48 hours prior to surgery.                 Do not bring valuables to the hospital. [place].  Contacts, dentures or bridgework may not be worn into surgery.  Leave suitcase in the car. After surgery it may be brought to your room.                Please read over the following fact sheets you were given: _____________________________________________________________________           [facility] - Preparing for Surgery Before surgery, you can play an important role.  Because skin is not sterile, your skin needs to be as free of germs as possible.  You can reduce the number of germs on your skin by washing with CHG (chlorahexidine gluconate) soap before surgery.  CHG is an antiseptic cleaner which kills germs and bonds with the skin to continue killing germs even after washing. Please DO NOT use if you have an allergy to CHG or antibacterial  soaps.  If your skin becomes reddened/irritated stop using the CHG and inform your nurse when you arrive at Short Stay. Do not shave (including legs and underarms) for at least 48 hours prior to the first CHG shower.  You may shave your face/neck. Please follow these instructions carefully:  1.  Shower with CHG Soap the night before surgery and the  morning of Surgery.  2.  If you choose to wash your hair, wash your hair first as usual with your  normal  shampoo.  3.  After you shampoo, rinse your hair and body thoroughly to remove the  shampoo.                           4.  Use CHG as you would any other liquid soap.  You can apply chg directly  to the skin and wash  Gently with a scrungie or clean washcloth.  5.  Apply the CHG Soap to your body ONLY FROM THE NECK DOWN.   Do not use on face/ open                           Wound or open sores. Avoid contact with eyes, ears mouth and genitals (private parts).                       Wash face,  Genitals (private parts) with your normal soap.             6.  Wash thoroughly, paying special attention to the area where your surgery  will be performed.  7.  Thoroughly rinse your body with warm water from the neck down.  8.  DO NOT shower/wash with your normal soap after using and rinsing off  the CHG Soap.                9.  Pat yourself dry with a clean towel.            10.  Wear clean pajamas.            11.  Place clean sheets on your bed the night of your first shower and do not  sleep with pets. Day of Surgery : Do not apply any lotions/deodorants the morning of surgery.  Please wear clean clothes to the hospital/surgery center.  FAILURE TO FOLLOW THESE INSTRUCTIONS MAY RESULT IN THE CANCELLATION OF YOUR SURGERY PATIENT SIGNATURE_________________________________  NURSE SIGNATURE__________________________________  ________________________________________________________________________  WHAT IS A BLOOD TRANSFUSION? Blood  Transfusion Information  A transfusion is the replacement of blood or some of its parts. Blood is made up of multiple cells which provide different functions.  Red blood cells carry oxygen and are used for blood loss replacement.  White blood cells fight against infection.  Platelets control bleeding.  Plasma helps clot blood.  Other blood products are available for specialized needs, such as hemophilia or other clotting disorders. BEFORE THE TRANSFUSION  Who gives blood for transfusions?   Healthy volunteers who are fully evaluated to make sure their blood is safe. This is blood bank blood. Transfusion therapy is the safest it has ever been in the practice of medicine. Before blood is taken from a donor, a complete history is taken to make sure that person has no history of diseases nor engages in risky social behavior (examples are intravenous drug use or sexual activity with multiple partners). The donor's travel history is screened to minimize risk of transmitting infections, such as malaria. The donated blood is tested for signs of infectious diseases, such as HIV and hepatitis. The blood is then tested to be sure it is compatible with you in order to minimize the chance of a transfusion reaction. If you or a relative donates blood, this is often done in anticipation of surgery and is not appropriate for emergency situations. It takes many days to process the donated blood. RISKS AND COMPLICATIONS Although transfusion therapy is very safe and saves many lives, the main dangers of transfusion include:   Getting an infectious disease.  Developing a transfusion reaction. This is an allergic reaction to something in the blood you were given. Every precaution is taken to prevent this. The decision to have a blood transfusion has been considered carefully by your caregiver before blood is given. Blood is not given unless the benefits outweigh  the risks. AFTER THE TRANSFUSION  Right after  receiving a blood transfusion, you will usually feel much better and more energetic. This is especially true if your red blood cells have gotten low (anemic). The transfusion raises the level of the red blood cells which carry oxygen, and this usually causes an energy increase.  The nurse administering the transfusion will monitor you carefully for complications. HOME CARE INSTRUCTIONS  No special instructions are needed after a transfusion. You may find your energy is better. Speak with your caregiver about any limitations on activity for underlying diseases you may have. SEEK MEDICAL CARE IF:   Your condition is not improving after your transfusion.  You develop redness or irritation at the intravenous (IV) site. SEEK IMMEDIATE MEDICAL CARE IF:  Any of the following symptoms occur over the next 12 hours:  Shaking chills.  You have a temperature by mouth above 102 F (38.9 C), not controlled by medicine.  Chest, back, or muscle pain.  People around you feel you are not acting correctly or are confused.  Shortness of breath or difficulty breathing.  Dizziness and fainting.  You get a rash or develop hives.  You have a decrease in urine output.  Your urine turns a dark color or changes to pink, red, or brown. Any of the following symptoms occur over the next 10 days:  You have a temperature by mouth above 102 F (38.9 C), not controlled by medicine.  Shortness of breath.  Weakness after normal activity.  The white part of the eye turns yellow (jaundice).  You have a decrease in the amount of urine or are urinating less often.  Your urine turns a dark color or changes to pink, red, or brown. Document Released: 07/24/2000 Document Revised: 10/19/2011 Document Reviewed: 03/12/2008 ExitCare Patient Information 2014 [place].  _______________________________________________________________________  Incentive Spirometer  An incentive spirometer is a tool that can  help keep your lungs clear and active. This tool measures how well you are filling your lungs with each breath. Taking long deep breaths may help reverse or decrease the chance of developing breathing (pulmonary) problems (especially infection) following:  A long period of time when you are unable to move or be active. BEFORE THE PROCEDURE   If the spirometer includes an indicator to show your best effort, your nurse or respiratory therapist will set it to a desired goal.  If possible, sit up straight or lean slightly forward. Try not to slouch.  Hold the incentive spirometer in an upright position. INSTRUCTIONS FOR USE  1. Sit on the edge of your bed if possible, or sit up as far as you can in bed or on a chair. 2. Hold the incentive spirometer in an upright position. 3. Breathe out normally. 4. Place the mouthpiece in your mouth and seal your lips tightly around it. 5. Breathe in slowly and as deeply as possible, raising the piston or the ball toward the top of the column. 6. Hold your breath for 3-5 seconds or for as long as possible. Allow the piston or ball to fall to the bottom of the column. 7. Remove the mouthpiece from your mouth and breathe out normally. 8. Rest for a few seconds and repeat Steps 1 through 7 at least 10 times every 1-2 hours when you are awake. Take your time and take a few normal breaths between deep breaths. 9. The spirometer may include an indicator to show your best effort. Use the indicator as a goal to work  toward during each repetition. 10. After each set of 10 deep breaths, practice coughing to be sure your lungs are clear. If you have an incision (the cut made at the time of surgery), support your incision when coughing by placing a pillow or rolled up towels firmly against it. Once you are able to get out of bed, walk around indoors and cough well. You may stop using the incentive spirometer when instructed by your caregiver.  RISKS AND COMPLICATIONS  Take  your time so you do not get dizzy or light-headed.  If you are in pain, you may need to take or ask for pain medication before doing incentive spirometry. It is harder to take a deep breath if you are having pain. AFTER USE  Rest and breathe slowly and easily.  It can be helpful to keep track of a log of your progress. Your caregiver can provide you with a simple table to help with this. If you are using the spirometer at home, follow these instructions: [place] IF:   You are having difficultly using the spirometer.  You have trouble using the spirometer as often as instructed.  Your pain medication is not giving enough relief while using the spirometer.  You develop fever of 100.5 F (38.1 C) or higher. SEEK IMMEDIATE MEDICAL CARE IF:   You cough up bloody sputum that had not been present before.  You develop fever of 102 F (38.9 C) or greater.  You develop worsening pain at or near the incision site. MAKE SURE YOU:   Understand these instructions.  Will watch your condition.  Will get help right away if you are not doing well or get worse. Document Released: 12/07/2006 Document Revised: 10/19/2011 Document Reviewed: 02/07/2007 [facility] Patient Information 2014 [place], [place].   ________________________________________________________________________

## 2018-03-28 NOTE — Telephone Encounter (Signed)
Spoke with Lorraine Lynch today to see how she was feeling.  She stated that the pain from Friday resolved by Saturday morning. She is feeling good today no follow up needed.

## 2018-03-29 ENCOUNTER — Encounter (HOSPITAL_COMMUNITY)
Admission: RE | Admit: 2018-03-29 | Discharge: 2018-03-29 | Disposition: A | Discharge status: Home / Self Care | Payer: 59 | Source: Ambulatory Visit | Attending: Gynecologic Oncology | Admitting: Gynecologic Oncology

## 2018-03-29 ENCOUNTER — Encounter (HOSPITAL_COMMUNITY): Payer: Self-pay

## 2018-03-29 ENCOUNTER — Other Ambulatory Visit: Payer: Self-pay

## 2018-03-29 DIAGNOSIS — Z01812 Encounter for preprocedural laboratory examination: Secondary | ICD-10-CM | POA: Diagnosis not present

## 2018-03-29 DIAGNOSIS — R1900 Intra-abdominal and pelvic swelling, mass and lump, unspecified site: Secondary | ICD-10-CM | POA: Insufficient documentation

## 2018-03-29 DIAGNOSIS — I498 Other specified cardiac arrhythmias: Secondary | ICD-10-CM | POA: Insufficient documentation

## 2018-03-29 DIAGNOSIS — Z0183 Encounter for blood typing: Secondary | ICD-10-CM | POA: Diagnosis not present

## 2018-03-29 DIAGNOSIS — Z01818 Encounter for other preprocedural examination: Secondary | ICD-10-CM | POA: Insufficient documentation

## 2018-03-29 HISTORY — DX: Pneumonia, unspecified organism: J18.9

## 2018-03-29 HISTORY — DX: Other specified postprocedural states: Z98.890

## 2018-03-29 HISTORY — DX: Other specified postprocedural states: R11.2

## 2018-03-29 HISTORY — DX: Prediabetes: R73.03

## 2018-03-29 HISTORY — DX: Encounter for other screening for genetic and chromosomal anomalies: Z13.79

## 2018-03-29 LAB — URINALYSIS, ROUTINE W REFLEX MICROSCOPIC
BILIRUBIN URINE: NEGATIVE
Glucose, UA: NEGATIVE mg/dL
Ketones, ur: NEGATIVE mg/dL
Nitrite: NEGATIVE
PH: 6 (ref 5.0–8.0)
Protein, ur: NEGATIVE mg/dL
SPECIFIC GRAVITY, URINE: 1.01 (ref 1.005–1.030)

## 2018-03-29 LAB — COMPREHENSIVE METABOLIC PANEL
ALBUMIN: 4 g/dL (ref 3.5–5.0)
ALT: 26 U/L (ref 0–44)
ANION GAP: 10 (ref 5–15)
AST: 17 U/L (ref 15–41)
Alkaline Phosphatase: 66 U/L (ref 38–126)
BUN: 16 mg/dL (ref 6–20)
CALCIUM: 9.7 mg/dL (ref 8.9–10.3)
CHLORIDE: 104 mmol/L (ref 98–111)
CO2: 29 mmol/L (ref 22–32)
Creatinine, Ser: 0.86 mg/dL (ref 0.44–1.00)
GFR calc Af Amer: >60 mL/min (ref >60)
GFR calc non Af Amer: >60 mL/min (ref >60)
GLUCOSE: 123 mg/dL — AB (ref 70–99)
Potassium: 4.1 mmol/L (ref 3.5–5.1)
SODIUM: 143 mmol/L (ref 135–145)
Total Bilirubin: 0.6 mg/dL (ref 0.3–1.2)
Total Protein: 7 g/dL (ref 6.5–8.1)

## 2018-03-29 LAB — CBC
HCT: 38.3 % (ref 36.0–46.0)
HEMOGLOBIN: 12 g/dL (ref 12.0–15.0)
MCH: 24.8 pg — ABNORMAL LOW (ref 26.0–34.0)
MCHC: 31.3 g/dL (ref 30.0–36.0)
MCV: 79.3 fL (ref 78.0–100.0)
PLATELETS: 323 10*3/uL (ref 150–400)
RBC: 4.83 MIL/uL (ref 3.87–5.11)
RDW: 16.2 % — ABNORMAL HIGH (ref 11.5–15.5)
WBC: 11.1 10*3/uL — ABNORMAL HIGH (ref 4.0–10.5)

## 2018-03-29 LAB — PREGNANCY, URINE: Preg Test, Ur: NEGATIVE

## 2018-03-29 LAB — ABO/RH: ABO/RH(D): B POS

## 2018-03-29 NOTE — Progress Notes (Signed)
UA done 03-29-18 routed to Dr. Denman George and Joylene John, NP

## 2018-03-30 LAB — URINE CULTURE

## 2018-03-30 NOTE — Progress Notes (Addendum)
Urine culture from preop  routed to Dr. Denman George via epic

## 2018-04-06 ENCOUNTER — Telehealth: Payer: Self-pay

## 2018-04-06 MED ORDER — DEXTROSE 5 % IV SOLN
3.0000 g | INTRAVENOUS | Status: AC
  Administered 2018-04-07: 3 g via INTRAVENOUS
  Filled 2018-04-06: qty 3

## 2018-04-06 NOTE — Telephone Encounter (Signed)
Returned pt's appt concerning she wanted to make sure it was ok to have her surgery as planned because she has just started her period.  I let her know, per Joylene John NP, that it is ok that she is on her period and to keep her surgery date as planned. Pt voiced understanding. No other needs per pt at this time.

## 2018-04-07 ENCOUNTER — Encounter (HOSPITAL_COMMUNITY): Payer: Self-pay | Admitting: Emergency Medicine

## 2018-04-07 ENCOUNTER — Inpatient Hospital Stay (HOSPITAL_COMMUNITY)
Admission: AD | Admit: 2018-04-07 | Discharge: 2018-04-09 | DRG: 742 | Disposition: A | Discharge status: Home / Self Care | Payer: 59 | Attending: Gynecologic Oncology | Admitting: Gynecologic Oncology

## 2018-04-07 ENCOUNTER — Ambulatory Visit (HOSPITAL_COMMUNITY): Payer: 59 | Admitting: Anesthesiology

## 2018-04-07 ENCOUNTER — Other Ambulatory Visit: Payer: Self-pay

## 2018-04-07 ENCOUNTER — Encounter (HOSPITAL_COMMUNITY)
Admission: AD | Disposition: A | Discharge status: Home / Self Care | Payer: Self-pay | Source: Home / Self Care | Attending: Gynecologic Oncology

## 2018-04-07 DIAGNOSIS — R1900 Intra-abdominal and pelvic swelling, mass and lump, unspecified site: Secondary | ICD-10-CM | POA: Diagnosis not present

## 2018-04-07 DIAGNOSIS — E669 Obesity, unspecified: Secondary | ICD-10-CM | POA: Diagnosis present

## 2018-04-07 DIAGNOSIS — Z86711 Personal history of pulmonary embolism: Secondary | ICD-10-CM

## 2018-04-07 DIAGNOSIS — N83201 Unspecified ovarian cyst, right side: Secondary | ICD-10-CM

## 2018-04-07 DIAGNOSIS — Z887 Allergy status to serum and vaccine status: Secondary | ICD-10-CM

## 2018-04-07 DIAGNOSIS — I10 Essential (primary) hypertension: Secondary | ICD-10-CM | POA: Diagnosis present

## 2018-04-07 DIAGNOSIS — D6852 Prothrombin gene mutation: Secondary | ICD-10-CM | POA: Diagnosis present

## 2018-04-07 DIAGNOSIS — F17200 Nicotine dependence, unspecified, uncomplicated: Secondary | ICD-10-CM | POA: Diagnosis present

## 2018-04-07 DIAGNOSIS — N83209 Unspecified ovarian cyst, unspecified side: Secondary | ICD-10-CM | POA: Diagnosis not present

## 2018-04-07 DIAGNOSIS — N946 Dysmenorrhea, unspecified: Secondary | ICD-10-CM | POA: Diagnosis present

## 2018-04-07 DIAGNOSIS — Z833 Family history of diabetes mellitus: Secondary | ICD-10-CM | POA: Diagnosis not present

## 2018-04-07 DIAGNOSIS — D270 Benign neoplasm of right ovary: Secondary | ICD-10-CM | POA: Diagnosis not present

## 2018-04-07 DIAGNOSIS — Z8349 Family history of other endocrine, nutritional and metabolic diseases: Secondary | ICD-10-CM

## 2018-04-07 DIAGNOSIS — Z8672 Personal history of thrombophlebitis: Secondary | ICD-10-CM | POA: Diagnosis not present

## 2018-04-07 DIAGNOSIS — Z6841 Body Mass Index (BMI) 40.0 and over, adult: Secondary | ICD-10-CM | POA: Diagnosis not present

## 2018-04-07 DIAGNOSIS — G4730 Sleep apnea, unspecified: Secondary | ICD-10-CM | POA: Diagnosis present

## 2018-04-07 DIAGNOSIS — Z807 Family history of other malignant neoplasms of lymphoid, hematopoietic and related tissues: Secondary | ICD-10-CM | POA: Diagnosis not present

## 2018-04-07 DIAGNOSIS — Z91040 Latex allergy status: Secondary | ICD-10-CM | POA: Diagnosis not present

## 2018-04-07 DIAGNOSIS — Z811 Family history of alcohol abuse and dependence: Secondary | ICD-10-CM | POA: Diagnosis not present

## 2018-04-07 DIAGNOSIS — Z8249 Family history of ischemic heart disease and other diseases of the circulatory system: Secondary | ICD-10-CM | POA: Diagnosis not present

## 2018-04-07 DIAGNOSIS — N816 Rectocele: Secondary | ICD-10-CM | POA: Diagnosis present

## 2018-04-07 DIAGNOSIS — R8569 Abnormal cytological findings in specimens from other digestive organs and abdominal cavity: Secondary | ICD-10-CM | POA: Diagnosis not present

## 2018-04-07 HISTORY — PX: LAPAROTOMY: SHX154

## 2018-04-07 HISTORY — PX: OOPHORECTOMY: SHX6387

## 2018-04-07 LAB — TYPE AND SCREEN
ABO/RH(D): B POS
ANTIBODY SCREEN: NEGATIVE

## 2018-04-07 SURGERY — LAPAROTOMY, EXPLORATORY
Anesthesia: General | Laterality: Right

## 2018-04-07 MED ORDER — FENTANYL CITRATE (PF) 100 MCG/2ML IJ SOLN
INTRAMUSCULAR | Status: AC
  Filled 2018-04-07: qty 2

## 2018-04-07 MED ORDER — LIDOCAINE 2% (20 MG/ML) 5 ML SYRINGE
INTRAMUSCULAR | Status: AC
  Filled 2018-04-07: qty 5

## 2018-04-07 MED ORDER — MIDAZOLAM HCL 2 MG/2ML IJ SOLN
INTRAMUSCULAR | Status: DC | PRN
  Administered 2018-04-07: 2 mg via INTRAVENOUS

## 2018-04-07 MED ORDER — CHEWING GUM (ORBIT) SUGAR FREE
1.0000 | CHEWING_GUM | Freq: Three times a day (TID) | ORAL | Status: DC
  Administered 2018-04-07 – 2018-04-09 (×5): 1 via ORAL
  Filled 2018-04-07: qty 1

## 2018-04-07 MED ORDER — ACETAMINOPHEN 500 MG PO TABS
1000.0000 mg | ORAL_TABLET | ORAL | Status: AC
  Administered 2018-04-07: 1000 mg via ORAL
  Filled 2018-04-07: qty 2

## 2018-04-07 MED ORDER — DEXAMETHASONE SODIUM PHOSPHATE 10 MG/ML IJ SOLN
INTRAMUSCULAR | Status: DC | PRN
  Administered 2018-04-07: 10 mg via INTRAVENOUS

## 2018-04-07 MED ORDER — LIDOCAINE 2% (20 MG/ML) 5 ML SYRINGE
INTRAMUSCULAR | Status: AC
  Filled 2018-04-07: qty 15

## 2018-04-07 MED ORDER — ENOXAPARIN SODIUM 40 MG/0.4ML ~~LOC~~ SOLN
40.0000 mg | SUBCUTANEOUS | Status: AC
  Administered 2018-04-07: 40 mg via SUBCUTANEOUS
  Filled 2018-04-07: qty 0.4

## 2018-04-07 MED ORDER — SODIUM CHLORIDE 0.9 % IJ SOLN
INTRAMUSCULAR | Status: AC
  Filled 2018-04-07: qty 20

## 2018-04-07 MED ORDER — LACTATED RINGERS IV SOLN
INTRAVENOUS | Status: DC
  Administered 2018-04-07 (×2): via INTRAVENOUS

## 2018-04-07 MED ORDER — PROPOFOL 10 MG/ML IV BOLUS
INTRAVENOUS | Status: DC | PRN
  Administered 2018-04-07: 150 mg via INTRAVENOUS
  Administered 2018-04-07: 50 mg via INTRAVENOUS

## 2018-04-07 MED ORDER — BUPIVACAINE HCL 0.25 % IJ SOLN
INTRAMUSCULAR | Status: DC | PRN
  Administered 2018-04-07: 20 mL

## 2018-04-07 MED ORDER — ACETAMINOPHEN 500 MG PO TABS
1000.0000 mg | ORAL_TABLET | Freq: Four times a day (QID) | ORAL | Status: DC
  Administered 2018-04-07 – 2018-04-09 (×5): 1000 mg via ORAL
  Filled 2018-04-07 (×5): qty 2

## 2018-04-07 MED ORDER — MEPERIDINE HCL 50 MG/ML IJ SOLN: 6.2500 mg | INTRAMUSCULAR | Status: DC | PRN

## 2018-04-07 MED ORDER — HYDROMORPHONE HCL 1 MG/ML IJ SOLN
INTRAMUSCULAR | Status: AC
  Administered 2018-04-07: 0.5 mg via INTRAVENOUS
  Filled 2018-04-07: qty 2

## 2018-04-07 MED ORDER — SUGAMMADEX SODIUM 500 MG/5ML IV SOLN
INTRAVENOUS | Status: AC
  Filled 2018-04-07: qty 5

## 2018-04-07 MED ORDER — ONDANSETRON HCL 4 MG/2ML IJ SOLN: 4.0000 mg | Freq: Once | INTRAMUSCULAR | Status: DC | PRN

## 2018-04-07 MED ORDER — 0.9 % SODIUM CHLORIDE (POUR BTL) OPTIME
TOPICAL | Status: DC | PRN
  Administered 2018-04-07: 1000 mL

## 2018-04-07 MED ORDER — LIDOCAINE 2% (20 MG/ML) 5 ML SYRINGE
INTRAMUSCULAR | Status: DC | PRN
  Administered 2018-04-07: 100 mg via INTRAVENOUS

## 2018-04-07 MED ORDER — ONDANSETRON HCL 4 MG/2ML IJ SOLN
INTRAMUSCULAR | Status: DC | PRN
  Administered 2018-04-07: 4 mg via INTRAVENOUS

## 2018-04-07 MED ORDER — PROPOFOL 10 MG/ML IV BOLUS
INTRAVENOUS | Status: AC
  Filled 2018-04-07: qty 20

## 2018-04-07 MED ORDER — HYDROMORPHONE HCL 1 MG/ML IJ SOLN
0.2500 mg | INTRAMUSCULAR | Status: DC | PRN
  Administered 2018-04-07 (×4): 0.5 mg via INTRAVENOUS

## 2018-04-07 MED ORDER — DEXAMETHASONE SODIUM PHOSPHATE 4 MG/ML IJ SOLN: 4.0000 mg | INTRAMUSCULAR | Status: DC

## 2018-04-07 MED ORDER — OXYCODONE HCL 5 MG PO TABS: 5.0000 mg | ORAL_TABLET | ORAL | Status: DC | PRN

## 2018-04-07 MED ORDER — GABAPENTIN 300 MG PO CAPS
600.0000 mg | ORAL_CAPSULE | Freq: Three times a day (TID) | ORAL | Status: DC
  Administered 2018-04-07 – 2018-04-08 (×4): 600 mg via ORAL
  Filled 2018-04-07 (×4): qty 2

## 2018-04-07 MED ORDER — SCOPOLAMINE 1 MG/3DAYS TD PT72
1.0000 | MEDICATED_PATCH | TRANSDERMAL | Status: DC
  Administered 2018-04-07: 1.5 mg via TRANSDERMAL
  Filled 2018-04-07: qty 1

## 2018-04-07 MED ORDER — NON FORMULARY: 1.0000 [IU] | Freq: Three times a day (TID) | Status: DC

## 2018-04-07 MED ORDER — TRAMADOL HCL 50 MG PO TABS
100.0000 mg | ORAL_TABLET | Freq: Four times a day (QID) | ORAL | Status: DC | PRN
  Administered 2018-04-07 – 2018-04-09 (×4): 100 mg via ORAL
  Filled 2018-04-07 (×4): qty 2

## 2018-04-07 MED ORDER — LIP MEDEX EX OINT
TOPICAL_OINTMENT | CUTANEOUS | Status: AC
  Filled 2018-04-07: qty 7

## 2018-04-07 MED ORDER — KETAMINE HCL 10 MG/ML IJ SOLN
INTRAMUSCULAR | Status: DC | PRN
  Administered 2018-04-07: 60 mg via INTRAVENOUS

## 2018-04-07 MED ORDER — ONDANSETRON HCL 4 MG PO TABS: 4.0000 mg | ORAL_TABLET | Freq: Four times a day (QID) | ORAL | Status: DC | PRN

## 2018-04-07 MED ORDER — ONDANSETRON HCL 4 MG/2ML IJ SOLN: 4.0000 mg | Freq: Four times a day (QID) | INTRAMUSCULAR | Status: DC | PRN

## 2018-04-07 MED ORDER — HYDROMORPHONE HCL 1 MG/ML IJ SOLN: 0.5000 mg | INTRAMUSCULAR | Status: DC | PRN

## 2018-04-07 MED ORDER — BUPIVACAINE HCL (PF) 0.25 % IJ SOLN
INTRAMUSCULAR | Status: AC
  Filled 2018-04-07: qty 30

## 2018-04-07 MED ORDER — ONDANSETRON HCL 4 MG/2ML IJ SOLN
INTRAMUSCULAR | Status: AC
  Filled 2018-04-07: qty 2

## 2018-04-07 MED ORDER — FENTANYL CITRATE (PF) 250 MCG/5ML IJ SOLN
INTRAMUSCULAR | Status: DC | PRN
  Administered 2018-04-07 (×3): 50 ug via INTRAVENOUS
  Administered 2018-04-07: 100 ug via INTRAVENOUS
  Administered 2018-04-07 (×4): 50 ug via INTRAVENOUS

## 2018-04-07 MED ORDER — FENTANYL CITRATE (PF) 250 MCG/5ML IJ SOLN
INTRAMUSCULAR | Status: AC
  Filled 2018-04-07: qty 5

## 2018-04-07 MED ORDER — SENNOSIDES-DOCUSATE SODIUM 8.6-50 MG PO TABS
2.0000 | ORAL_TABLET | Freq: Every day | ORAL | Status: DC
  Administered 2018-04-08: 2 via ORAL
  Filled 2018-04-07 (×2): qty 2

## 2018-04-07 MED ORDER — LISINOPRIL 20 MG PO TABS
20.0000 mg | ORAL_TABLET | Freq: Every day | ORAL | Status: DC
  Administered 2018-04-08: 20 mg via ORAL
  Filled 2018-04-07: qty 1

## 2018-04-07 MED ORDER — SODIUM CHLORIDE 0.9 % IJ SOLN
INTRAMUSCULAR | Status: DC | PRN
  Administered 2018-04-07: 20 mL

## 2018-04-07 MED ORDER — IBUPROFEN 200 MG PO TABS
600.0000 mg | ORAL_TABLET | Freq: Four times a day (QID) | ORAL | Status: DC
  Administered 2018-04-08 (×3): 600 mg via ORAL
  Filled 2018-04-07 (×4): qty 3

## 2018-04-07 MED ORDER — ROCURONIUM BROMIDE 10 MG/ML (PF) SYRINGE
PREFILLED_SYRINGE | INTRAVENOUS | Status: DC | PRN
  Administered 2018-04-07: 20 mg via INTRAVENOUS
  Administered 2018-04-07: 50 mg via INTRAVENOUS

## 2018-04-07 MED ORDER — LIDOCAINE 2% (20 MG/ML) 5 ML SYRINGE
INTRAMUSCULAR | Status: DC | PRN
  Administered 2018-04-07: 1.5 mg/kg/h via INTRAVENOUS

## 2018-04-07 MED ORDER — ENOXAPARIN SODIUM 40 MG/0.4ML ~~LOC~~ SOLN
40.0000 mg | SUBCUTANEOUS | Status: DC
  Administered 2018-04-08 – 2018-04-09 (×3): 40 mg via SUBCUTANEOUS
  Filled 2018-04-07 (×3): qty 0.4

## 2018-04-07 MED ORDER — CELECOXIB 200 MG PO CAPS
400.0000 mg | ORAL_CAPSULE | ORAL | Status: AC
  Administered 2018-04-07: 400 mg via ORAL
  Filled 2018-04-07: qty 2

## 2018-04-07 MED ORDER — DEXAMETHASONE SODIUM PHOSPHATE 10 MG/ML IJ SOLN
INTRAMUSCULAR | Status: AC
  Filled 2018-04-07: qty 1

## 2018-04-07 MED ORDER — BUPIVACAINE LIPOSOME 1.3 % IJ SUSP
20.0000 mL | Freq: Once | INTRAMUSCULAR | Status: AC
  Administered 2018-04-07: 20 mL
  Filled 2018-04-07: qty 20

## 2018-04-07 MED ORDER — SUGAMMADEX SODIUM 200 MG/2ML IV SOLN
INTRAVENOUS | Status: DC | PRN
  Administered 2018-04-07: 300 mg via INTRAVENOUS

## 2018-04-07 MED ORDER — KCL IN DEXTROSE-NACL 20-5-0.45 MEQ/L-%-% IV SOLN
INTRAVENOUS | Status: DC
  Administered 2018-04-07: 18:00:00 via INTRAVENOUS
  Filled 2018-04-07: qty 1000

## 2018-04-07 MED ORDER — ENSURE ENLIVE PO LIQD
237.0000 mL | Freq: Two times a day (BID) | ORAL | Status: DC
  Administered 2018-04-08 (×2): 237 mL via ORAL

## 2018-04-07 MED ORDER — GABAPENTIN 300 MG PO CAPS
300.0000 mg | ORAL_CAPSULE | ORAL | Status: DC
  Filled 2018-04-07: qty 1

## 2018-04-07 MED ORDER — MIDAZOLAM HCL 2 MG/2ML IJ SOLN
INTRAMUSCULAR | Status: AC
  Filled 2018-04-07: qty 2

## 2018-04-07 MED ORDER — ROCURONIUM BROMIDE 10 MG/ML (PF) SYRINGE
PREFILLED_SYRINGE | INTRAVENOUS | Status: AC
  Filled 2018-04-07: qty 10

## 2018-04-07 SURGICAL SUPPLY — 64 items
ADH SKN CLS APL DERMABOND .7 (GAUZE/BANDAGES/DRESSINGS) ×2
AGENT HMST MTR 8 SURGIFLO (HEMOSTASIS)
ATTRACTOMAT 16X20 MAGNETIC DRP (DRAPES) ×1 IMPLANT
BLADE EXTENDED COATED 6.5IN (ELECTRODE) ×3 IMPLANT
CELLS DAT CNTRL 66122 CELL SVR (MISCELLANEOUS) IMPLANT
CHLORAPREP W/TINT 26ML (MISCELLANEOUS) ×4 IMPLANT
CLIP VESOCCLUDE LG 6/CT (CLIP) ×3 IMPLANT
CLIP VESOCCLUDE MED 6/CT (CLIP) ×3 IMPLANT
CLIP VESOCCLUDE MED LG 6/CT (CLIP) ×3 IMPLANT
CONT SPEC 4OZ CLIKSEAL STRL BL (MISCELLANEOUS) ×1 IMPLANT
COVER SURGICAL LIGHT HANDLE (MISCELLANEOUS) ×1 IMPLANT
DERMABOND ADVANCED (GAUZE/BANDAGES/DRESSINGS) ×1
DERMABOND ADVANCED .7 DNX12 (GAUZE/BANDAGES/DRESSINGS) IMPLANT
DRAPE INCISE IOBAN 66X45 STRL (DRAPES) IMPLANT
DRAPE WARM FLUID 44X44 (DRAPE) ×3 IMPLANT
DRSG OPSITE POSTOP 4X10 (GAUZE/BANDAGES/DRESSINGS) IMPLANT
DRSG OPSITE POSTOP 4X6 (GAUZE/BANDAGES/DRESSINGS) IMPLANT
DRSG OPSITE POSTOP 4X8 (GAUZE/BANDAGES/DRESSINGS) ×2 IMPLANT
ELECT REM PT RETURN 15FT ADLT (MISCELLANEOUS) ×3 IMPLANT
GAUZE 4X4 16PLY RFD (DISPOSABLE) IMPLANT
GLOVE BIO SURGEON STRL SZ 6 (GLOVE) ×4 IMPLANT
GLOVE BIO SURGEON STRL SZ 6.5 (GLOVE) ×4 IMPLANT
GOWN STRL REUS W/ TWL LRG LVL3 (GOWN DISPOSABLE) ×4 IMPLANT
GOWN STRL REUS W/TWL LRG LVL3 (GOWN DISPOSABLE) ×6
HEMOSTAT ARISTA ABSORB 3G PWDR (MISCELLANEOUS) IMPLANT
HEMOSTAT SURGICEL 4X8 (HEMOSTASIS) ×1 IMPLANT
KIT BASIN OR (CUSTOM PROCEDURE TRAY) ×3 IMPLANT
LIGASURE IMPACT 36 18CM CVD LR (INSTRUMENTS) ×1 IMPLANT
LOOP VESSEL MAXI BLUE (MISCELLANEOUS) IMPLANT
NEEDLE HYPO 22GX1.5 SAFETY (NEEDLE) ×6 IMPLANT
NS IRRIG 1000ML POUR BTL (IV SOLUTION) ×6 IMPLANT
PACK GENERAL/GYN (CUSTOM PROCEDURE TRAY) ×3 IMPLANT
RELOAD PROXIMATE 75MM BLUE (ENDOMECHANICALS) IMPLANT
RELOAD PROXIMATE TA60MM BLUE (ENDOMECHANICALS) IMPLANT
RELOAD STAPLE 60 BLU REG PROX (ENDOMECHANICALS) IMPLANT
RELOAD STAPLE 75 3.8 BLU REG (ENDOMECHANICALS) IMPLANT
RETRACTOR WND ALEXIS 18 MED (MISCELLANEOUS) IMPLANT
RETRACTOR WND ALEXIS 25 LRG (MISCELLANEOUS) IMPLANT
RTRCTR WOUND ALEXIS 18CM MED (MISCELLANEOUS)
RTRCTR WOUND ALEXIS 25CM LRG (MISCELLANEOUS)
SHEET LAVH (DRAPES) ×3 IMPLANT
SPOGE SURGIFLO 8M (HEMOSTASIS)
SPONGE LAP 18X18 RF (DISPOSABLE) IMPLANT
SPONGE SURGIFLO 8M (HEMOSTASIS) IMPLANT
STAPLER GUN LINEAR PROX 60 (STAPLE) IMPLANT
STAPLER PROXIMATE 75MM BLUE (STAPLE) IMPLANT
STAPLER VISISTAT 35W (STAPLE) IMPLANT
SUT MNCRL AB 4-0 PS2 18 (SUTURE) ×6 IMPLANT
SUT PDS AB 1 TP1 96 (SUTURE) ×6 IMPLANT
SUT SILK 3 0 SH CR/8 (SUTURE) IMPLANT
SUT VIC AB 0 CT1 36 (SUTURE) ×8 IMPLANT
SUT VIC AB 2-0 CT1 36 (SUTURE) ×7 IMPLANT
SUT VIC AB 2-0 CT2 27 (SUTURE) ×13 IMPLANT
SUT VIC AB 2-0 SH 27 (SUTURE)
SUT VIC AB 2-0 SH 27X BRD (SUTURE) IMPLANT
SUT VIC AB 3-0 CTX 36 (SUTURE) ×2 IMPLANT
SUT VIC AB 3-0 SH 18 (SUTURE) IMPLANT
SUT VIC AB 3-0 SH 27 (SUTURE) ×3
SUT VIC AB 3-0 SH 27X BRD (SUTURE) ×2 IMPLANT
SYR 30ML LL (SYRINGE) ×6 IMPLANT
TOWEL OR 17X26 10 PK STRL BLUE (TOWEL DISPOSABLE) ×3 IMPLANT
TOWEL OR NON WOVEN STRL DISP B (DISPOSABLE) ×3 IMPLANT
TRAY FOLEY MTR SLVR 16FR STAT (SET/KITS/TRAYS/PACK) ×3 IMPLANT
UNDERPAD 30X30 (UNDERPADS AND DIAPERS) ×3 IMPLANT

## 2018-04-07 NOTE — Interval H&P Note (Signed)
History and Physical Interval Note:  04/07/2018 9:50 AM  [name]  has presented today for surgery, with Lorraine diagnosis of PELVIC MASS  Lorraine various methods of treatment have been discussed with Lorraine patient and family. After consideration of risks, benefits and other options for treatment, Lorraine patient has consented to  Procedure(s): EXPLORATORY LAPAROTOMY WITH POSSIBLE TOTAL ABDOMINAL HYSTERECTOMYAND POSSIBLE STAGING BILATERAL SALPINGECTOMY (N/A) UNILATERAL OOPHORECTOMY (N/A) as a surgical intervention .  Lorraine patient's history has been reviewed, patient examined, no change in status, stable for surgery.  I have reviewed Lorraine patient's chart and labs.  Questions were answered to Lorraine patient's satisfaction.  I discussed with Lorraine patient that I will not remove her contralateral tube unless it is straightforward and there is no added risk to elective opportunistic salpingectomy. She is in agreement.   Thereasa Solo

## 2018-04-07 NOTE — Anesthesia Procedure Notes (Signed)
Procedure Name: Intubation Date/Time: 04/07/2018 10:14 AM Performed by: Sharlette Dense, CRNA Patient Re-evaluated:Patient Re-evaluated prior to induction Oxygen Delivery Method: Circle system utilized Preoxygenation: Pre-oxygenation with 100% oxygen Induction Type: IV induction Ventilation: Mask ventilation without difficulty and Oral airway inserted - appropriate to patient size Laryngoscope Size: Sabra Heck and 2 Grade View: Grade I Tube type: Oral Tube size: 7.5 mm Number of attempts: 1 Airway Equipment and Method: Stylet Placement Confirmation: ETT inserted through vocal cords under direct vision,  positive ETCO2 and breath sounds checked- equal and bilateral Secured at: 21 cm Tube secured with: Tape Dental Injury: Teeth and Oropharynx as per pre-operative assessment

## 2018-04-07 NOTE — Progress Notes (Signed)
I have reviewed and concur with this student's documentation.   

## 2018-04-07 NOTE — Transfer of Care (Signed)
Immediate Anesthesia Transfer of Care Note  Patient: Lorraine Lynch  Procedure(s) Performed: EXPLORATORY LAPAROTOMY (N/A ) RIGHT SALPINGO-OOPHORECTOMY (Right )  Patient Location: PACU  Anesthesia Type:General  Level of Consciousness: awake and alert   Airway & Oxygen Therapy: Patient Spontanous Breathing and Patient connected to face mask oxygen  Post-op Assessment: Report given to RN and Post -op Vital signs reviewed and stable  Post vital signs: Reviewed and stable  Last Vitals:  Vitals Value Taken Time  BP 144/83 04/07/2018 11:49 AM  Temp    Pulse 104 04/07/2018 11:50 AM  Resp 11 04/07/2018 11:50 AM  SpO2 100 % 04/07/2018 11:50 AM  Vitals shown include unvalidated device data.  Last Pain:  Vitals:   04/07/18 0814  TempSrc:   PainSc: 0-No pain      Patients Stated Pain Goal: 4 (18/56/31 4970)  Complications: No apparent anesthesia complications

## 2018-04-07 NOTE — Anesthesia Postprocedure Evaluation (Signed)
Anesthesia Post Note  Patient: KRYSTL WICKWARE  Procedure(s) Performed: EXPLORATORY LAPAROTOMY (N/A ) RIGHT SALPINGO-OOPHORECTOMY (Right )     Patient location during evaluation: PACU Anesthesia Type: General Level of consciousness: awake and alert Pain management: pain level controlled Vital Signs Assessment: post-procedure vital signs reviewed and stable Respiratory status: spontaneous breathing, nonlabored ventilation, respiratory function stable and patient connected to nasal cannula oxygen Cardiovascular status: blood pressure returned to baseline and stable Postop Assessment: no apparent nausea or vomiting Anesthetic complications: no    Last Vitals:  Vitals:   04/07/18 1322 04/07/18 1405  BP: 136/82 132/84  Pulse: 94 93  Resp: 16 13  Temp: 37 C 37.1 C  SpO2: 99% 100%    Last Pain:  Vitals:   04/07/18 1405  TempSrc: Oral  PainSc: 2                  Desiree Daise DAVID

## 2018-04-07 NOTE — Op Note (Signed)
OPERATIVE NOTE Date: 04/07/18 Preoperative Diagnosis:  Right ovarian mass   Postoperative Diagnosis:same (benign right ovarian cyst)    Procedure(s) Performed: Exploratory laparotomy with right salpingo-oophorectomy.  Surgeon: Thereasa Solo, MD.  Assistant Surgeon: Lahoma Crocker, M.D. Assistant: (an MD assistant was necessary for tissue manipulation, retraction and positioning due to the complexity of the case and hospital policies).   Specimens: washings, right tube and ovary   Estimated Blood Loss: 50 mL.    Urine TIWPYK:998 cc  Complications: None.   Operative Findings: 12cm smooth right ovarian mass with torsion. Benign cystadenofibroma on pathology.    Procedure:   The patient was seen in the Holding Room. The risks, benefits, complications, treatment options, and expected outcomes were discussed with the patient.  The patient concurred with the proposed plan, giving informed consent.   The patient was  identified as Lorraine Lynch  and the procedure verified as USO, possible staging. A Time Out was held and the above information confirmed upon entry to the operating room..  After induction of anesthesia, the patient was draped and prepped in the usual sterile manner.  She was prepped and draped in the normal sterile fashion in the dorsal lithotomy position in padded Allen stirrups with good attention paid to support of the lower back and lower extremities. Position was adjusted for appropriate support. A Foley catheter was placed to gravity.   A midline vertical incision was made and carried through the subcutaneous tissue to the fascia. The fascial incision was made and extended superiorally. The rectus muscles were separated. The peritoneum was identified and entered. Peritoneal incision was extended longitudinally.  The abdominal cavity was entered sharply and without incident. A Bookwalter retractor was then placed. A survey of the abdomen and pelvis revealed the above findings,  which were significant for a 12cm right ovarian mass with torsion, and a normal left tube and ovary and uterus. There was significant intraperitoneal obesity and this limited visualization of pelvic structures and required extensive packing.  Washings were obtained from the peritoneal cavity.  An alexis retractor was placed.  The cystic mass was identified and brought to the incision. A gallbladder trochar was used to puncture the cyst and evacuate it of fluid to decompress it. No gross spillage of cyst fluid took place.Allis clamps were used to elevated the puncture site as the cyst was deflated.  The deflated cystic mass was delivered through the abdominal incision.  The right ovarian pedicle was cross clamped and transected and the specimen was sent for frozen section which was benign.   The right retroperitoneum was then opened parallel to the ovarian vessels. A window was made in the medial leaf of the broad ligament above the level of the ureter. The right utero-ovarian ligament and infundibulopelvic ligament were skeletonized and sealed and transected with ligasure and suture. Hemostasis was observed at the utero-ovarian and IP ligaments. The uterus and contralateral ovary were inspected and noted to be normal.  The peritoneal cavity was irrigated and hemostasis was confirmed at all surgical sites.  The fascia was reapproximated with #1 looped PDS using a total of two sutures. The subcutaneous layer was then irrigated copiously.  Exparel long acting local anesthetic was infiltrated into the subcutaneous tissues. The skin was closed with subcuticular suture. The patient tolerated the procedure well.   Sponge, lap and needle counts were correct x 2.   Donaciano Eva, MD

## 2018-04-07 NOTE — Anesthesia Preprocedure Evaluation (Signed)
Anesthesia Evaluation  Patient identified by MRN, date of birth, ID band Patient awake    Reviewed: Allergy & Precautions, NPO status , Patient's Chart, lab work & pertinent test results  History of Anesthesia Complications (+) PONV  Airway Mallampati: I  TM Distance: >3 FB Neck ROM: Full    Dental   Pulmonary sleep apnea , Current Smoker,    Pulmonary exam normal        Cardiovascular hypertension, Pt. on medications Normal cardiovascular exam     Neuro/Psych    GI/Hepatic   Endo/Other    Renal/GU      Musculoskeletal   Abdominal   Peds  Hematology   Anesthesia Other Findings   Reproductive/Obstetrics                             Anesthesia Physical Anesthesia Plan  ASA: III  Anesthesia Plan: General   Post-op Pain Management:    Induction: Intravenous  PONV Risk Score and Plan: 3 and Ondansetron, Midazolam and Scopolamine patch - Pre-op  Airway Management Planned: Oral ETT  Additional Equipment:   Intra-op Plan:   Post-operative Plan: Extubation in OR  Informed Consent: I have reviewed the patients History and Physical, chart, labs and discussed the procedure including the risks, benefits and alternatives for the proposed anesthesia with the patient or authorized representative who has indicated his/her understanding and acceptance.     Plan Discussed with: CRNA and Surgeon  Anesthesia Plan Comments:         Anesthesia Quick Evaluation

## 2018-04-08 ENCOUNTER — Encounter: Payer: Self-pay | Admitting: Family Medicine

## 2018-04-08 ENCOUNTER — Encounter (HOSPITAL_COMMUNITY): Payer: Self-pay | Admitting: Gynecologic Oncology

## 2018-04-08 LAB — BASIC METABOLIC PANEL
Anion gap: 9 (ref 5–15)
BUN: 13 mg/dL (ref 6–20)
CO2: 27 mmol/L (ref 22–32)
CREATININE: 0.63 mg/dL (ref 0.44–1.00)
Calcium: 9 mg/dL (ref 8.9–10.3)
Chloride: 104 mmol/L (ref 98–111)
GFR calc non Af Amer: >60 mL/min (ref >60)
GLUCOSE: 141 mg/dL — AB (ref 70–99)
Potassium: 4.3 mmol/L (ref 3.5–5.1)
Sodium: 140 mmol/L (ref 135–145)

## 2018-04-08 LAB — CBC
HEMATOCRIT: 34.9 % — AB (ref 36.0–46.0)
HEMOGLOBIN: 10.8 g/dL — AB (ref 12.0–15.0)
MCH: 24.3 pg — AB (ref 26.0–34.0)
MCHC: 30.9 g/dL (ref 30.0–36.0)
MCV: 78.6 fL (ref 78.0–100.0)
Platelets: 336 10*3/uL (ref 150–400)
RBC: 4.44 MIL/uL (ref 3.87–5.11)
RDW: 15.6 % — ABNORMAL HIGH (ref 11.5–15.5)
WBC: 15.5 10*3/uL — ABNORMAL HIGH (ref 4.0–10.5)

## 2018-04-08 MED ORDER — ENOXAPARIN (LOVENOX) PATIENT EDUCATION KIT
PACK | Freq: Once | Status: AC
  Administered 2018-04-08: 15:00:00
  Filled 2018-04-08: qty 1

## 2018-04-08 NOTE — Progress Notes (Addendum)
1 Day Post-Op Procedure(s) (LRB): EXPLORATORY LAPAROTOMY (N/A) RIGHT SALPINGO-OOPHORECTOMY (Right)  Subjective: Patient reports doing well this am.  Tolerated solid food for breakfast with no nausea or emesis.  Pain managed with PO pain medication.  Ambulating without difficulty.  Due to void since foley removal.  Denies chest pain or dyspnea.  Denies passing flatus or having a BM.  No concerns voiced.  Objective: Vital signs in last 24 hours: Temp:  [97.8 F (36.6 C)-99.1 F (37.3 C)] 98.3 F (36.8 C) (08/30 0536) Pulse Rate:  [68-108] 81 (08/30 0536) Resp:  [11-18] 18 (08/30 0536) BP: (114-144)/(69-92) 134/73 (08/30 0536) SpO2:  [99 %-100 %] 100 % (08/30 0536) Last BM Date: 04/06/18  Intake/Output from previous day: 08/29 0701 - 08/30 0700 In: 2444.2 [P.O.:320; I.V.:2074.2; IV Piggyback:50] Out: 2625 [Urine:2575; Blood:50]  Physical Examination: General: alert, cooperative and no distress Resp: clear to auscultation bilaterally Cardio: regular rate and rhythm, S1, S2 normal, no murmur, click, rub or gallop GI: soft, non-tender; bowel sounds normal; no masses,  no organomegaly, incision: midline op site to the abdomen with no active drainage noted, clean and intact and abdomen obese, active bowel sounds noted Extremities: extremities normal, atraumatic, no cyanosis or edema  Labs: WBC/Hgb/Hct/Plts:  15.5/10.8/34.9/336 (08/30 0442) BUN/Cr/glu/ALT/AST/amyl/lip:  13/0.63/--/--/--/--/-- (08/30 3419)  Assessment: 37 y.o. s/p Procedure(s): EXPLORATORY LAPAROTOMY RIGHT SALPINGO-OOPHORECTOMY: stable Pain:  Pain is well-controlled on PRN medications.  Heme: Appropriate this am: Hgb 10.8 and Hct 34.9 this am.  CV: BP and HR stable this am. Lisinopril ordered.  Continue to monitor with vital sign assessments.   GI:  Tolerating po: Yes. Scopolamine patch in place.  Antiemetics ordered PRN.  GU: Due to void since foley removal.  Adequate output recorded.    FEN: Appropriate this  am.  Prophylaxis: SCDs and lovenox.  Plan for lovenox 40 mg daily for two weeks then may resume Eliquis per Dr. Denman George.  Plan: Saline lock IV Encourage ambulation, IS use, deep breathing, coughing, chewing gum use Continue plan of care per Dr. Gerarda Fraction Plan for discharge possibly later today if continuing to do well and +flatus and/or BM   LOS: 1 day    Melissa D Cross 04/08/2018, 10:32 AM  Patient seen and examined this afternoon. Agree with above plan of care. No concerns voiced by patient. Encouraged continued ambulation.

## 2018-04-08 NOTE — Progress Notes (Signed)
Note received from neurosurgery. Date of service 02/22/2018.  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  Dr. Christella Noa believes operative decompression is indicated.  No improvement with epidural injection.  Currently on Eliquis and will stop Eliquis before surgery.  Note will be sent to scan.

## 2018-04-09 MED ORDER — OXYCODONE HCL 5 MG PO TABS: 5.0000 mg | ORAL_TABLET | ORAL | 0 refills | Status: DC | PRN

## 2018-04-09 MED ORDER — ENOXAPARIN SODIUM 40 MG/0.4ML ~~LOC~~ SOLN: 40.0000 mg | SUBCUTANEOUS | 0 refills | Status: DC

## 2018-04-09 MED ORDER — APIXABAN 5 MG PO TABS: 5.0000 mg | ORAL_TABLET | Freq: Two times a day (BID) | ORAL | 6 refills | Status: DC

## 2018-04-09 NOTE — Progress Notes (Signed)
Pt stable and ready for discharge. IV removed, pt tolerating diet, passing gas and pain well controlled. Discharge paperwork reviewed and pt verbalizes understanding.

## 2018-04-09 NOTE — Discharge Instructions (Signed)
04/07/2018  Return to work: 4-6 weeks  Activity: 1. Be up and out of the bed during the day.  Take a nap if needed.  You may walk up steps but be careful and use the hand rail.  Stair climbing will tire you more than you think, you may need to stop part way and rest.   2. No lifting or straining for 6 weeks.  3. No driving for 2 weeks.  Do Not drive if you are taking narcotic pain medicine.  4. Shower daily.  Use soap and water on your incision and pat dry; don't rub.   5. No sexual activity and nothing in the vagina for 2 weeks.  Medications:  - Take ibuprofen and tylenol first line for pain control. Take these regularly (every 6 hours) to decrease the build up of pain.  - If necessary, for severe pain not relieved by ibuprofen, take percocet.  - While taking percocet you should take sennakot every night to reduce the likelihood of constipation. If this causes diarrhea, stop its use.  - take lovenox shots once daily for 12 doses. After completing 12 days of lovenox, you can stop the lovenox shots and restart the eloquis medication you had been previously prescribed.   Diet: 1. Low sodium Heart Healthy Diet is recommended.  2. It is safe to use a laxative if you have difficulty moving your bowels.   Wound Care: 1. Keep clean and dry.  Shower daily. 2. You can get the dressing wet in the shower, but avoid tub baths. 3. Remove the dressing between 5 and 7 days after your surgery (1 week after your operation). 4. After the dressing is removed you can get the incision wet in the shower, however continue to avoid tub baths until advised otherwise by your surgeon at follow-up.   Reasons to call the Doctor:   Fever - Oral temperature greater than 100.4 degrees Fahrenheit  Foul-smelling vaginal discharge  Difficulty urinating  Nausea and vomiting  Increased pain at the site of the incision that is unrelieved with pain medicine.  Difficulty breathing with or without chest  pain  New calf pain especially if only on one side  Sudden, continuing increased vaginal bleeding with or without clots.   Follow-up: 1. See Everitt Amber in 4 weeks.  Contacts: For questions or concerns you should contact:  Dr. Everitt Amber at [phone] After hours and on week-ends call [phone] and ask to speak to the physician on call for Gynecologic Oncology

## 2018-04-09 NOTE — Progress Notes (Signed)
Discharge Planning: Referral for Eliquis. Pt states she was Eliquis prior to admission and she has copay card for $10. Jonnie Finner RN CCM Case Mgmt phone [phone]

## 2018-04-09 NOTE — Discharge Summary (Signed)
Physician Discharge Summary  Patient ID: Lorraine Lynch MRN: [id number] DOB/AGE: 1980-08-13 37 y.o.  Admit date: 04/07/2018 Discharge date: 04/09/2018  Admission Diagnoses: Ovarian cyst  Discharge Diagnoses:  Principal Problem:   Ovarian cyst Active Problems:   Pelvic mass   Discharged Condition: good  Hospital Course:  1/ patient was admitted on 04/07/18 for an ex lap, right salpingo oophorectomy for right ovarian cystic mass and torsion 2/ surgery was uncomplicated  3/ on postoperative day 2 the patient was meeting discharge criteria: tolerating PO, voiding urine, ambulating, pain well controlled on oral medications.  4/ new medications on discharge include lovenox x 2 weeks prophylaxis (will then restart epixiban). Oxycodone.  Consults: None  Significant Diagnostic Studies: labs:  CBC    Component Value Date/Time   WBC 15.5 (H) 04/08/2018 0442   RBC 4.44 04/08/2018 0442   HGB 10.8 (L) 04/08/2018 0442   HGB 12.7 03/10/2018 1305   HCT 34.9 (L) 04/08/2018 0442   PLT 336 04/08/2018 0442   PLT 353 03/10/2018 1305   MCV 78.6 04/08/2018 0442   MCH 24.3 (L) 04/08/2018 0442   MCHC 30.9 04/08/2018 0442   RDW 15.6 (H) 04/08/2018 0442   LYMPHSABS 3.5 (H) 03/10/2018 1305   MONOABS 1.3 (H) 03/10/2018 1305   EOSABS 0.2 03/10/2018 1305   BASOSABS 0.0 03/10/2018 1305     Treatments: surgery: see above (benign mass on frozen section).  Discharge Exam: Blood pressure 124/63, pulse 69, temperature 97.8 F (36.6 C), temperature source Oral, resp. rate 16, height 5\' 6"  (1.676 m), weight 269 lb (122 kg), last menstrual period 04/06/2018, SpO2 100 %. General appearance: alert and cooperative GI: soft, non-tender; bowel sounds normal; no masses,  no organomegaly Incision/Wound:  Disposition: Discharge disposition: 01-Home or Self Care       Discharge Instructions    (HEART FAILURE PATIENTS) Call MD:  Anytime you have any of the following symptoms: 1) 3 pound weight gain in 24  hours or 5 pounds in 1 week 2) shortness of breath, with or without a dry hacking cough 3) swelling in the hands, feet or stomach 4) if you have to sleep on extra pillows at night in order to breathe.   Complete by:  As directed    Call MD for:  difficulty breathing, headache or visual disturbances   Complete by:  As directed    Call MD for:  extreme fatigue   Complete by:  As directed    Call MD for:  hives   Complete by:  As directed    Call MD for:  persistant dizziness or light-headedness   Complete by:  As directed    Call MD for:  persistant nausea and vomiting   Complete by:  As directed    Call MD for:  redness, tenderness, or signs of infection (pain, swelling, redness, odor or green/yellow discharge around incision site)   Complete by:  As directed    Call MD for:  severe uncontrolled pain   Complete by:  As directed    Call MD for:  temperature >100.4   Complete by:  As directed    Diet - low sodium heart healthy   Complete by:  As directed    Diet general   Complete by:  As directed    Driving Restrictions   Complete by:  As directed    No driving for 7 days or until off narcotic pain medication   Increase activity slowly   Complete by:  As directed  Remove dressing in 24 hours   Complete by:  As directed    Sexual Activity Restrictions   Complete by:  As directed    No intercourse for 6 weeks     Allergies as of 04/09/2018      Reactions   Latex Hives, Itching, Rash   Burning   Tuberculin Ppd Dermatitis, Rash      Medication List    TAKE these medications   apixaban 5 MG Tabs tablet Commonly known as:  ELIQUIS Take 1 tablet (5 mg total) by mouth 2 (two) times daily. Start taking on:  04/21/2018 What changed:  These instructions start on 04/21/2018. If you are unsure what to do until then, ask your doctor or other care provider.   cyclobenzaprine 10 MG tablet Commonly known as:  FLEXERIL Take 10 mg by mouth 3 (three) times daily as needed for muscle  spasms.   enoxaparin 40 MG/0.4ML injection Commonly known as:  LOVENOX Inject 0.4 mLs (40 mg total) into the skin daily. Start taking on:  04/10/2018   gabapentin 300 MG capsule Commonly known as:  NEURONTIN Take 2 capsules (600 mg total) by mouth 3 (three) times daily.   ibuprofen 200 MG tablet Commonly known as:  ADVIL,MOTRIN Take 400 mg by mouth 2 (two) times daily as needed for headache or moderate pain.   lisinopril 20 MG tablet Commonly known as:  PRINIVIL,ZESTRIL Take 1 tablet (20 mg total) by mouth daily.   oxyCODONE 5 MG immediate release tablet Commonly known as:  Oxy IR/ROXICODONE Take 1 tablet (5 mg total) by mouth every 4 (four) hours as needed for severe pain or breakthrough pain.      Follow-up Information    Everitt Amber, MD In 3 weeks.   Specialty:  Gynecologic Oncology Contact information: [address] [place] [address] [phone]           Signed: Thereasa Solo 04/09/2018, 10:27 AM

## 2018-04-12 ENCOUNTER — Other Ambulatory Visit: Payer: Self-pay | Admitting: *Deleted

## 2018-04-12 ENCOUNTER — Encounter: Payer: Self-pay | Admitting: *Deleted

## 2018-04-12 NOTE — Patient Outreach (Signed)
[facility]) Care Management  04/12/2018  Lorraine Lynch 09-12-1980 [id number]   Subjective: Telephone call to patient's home / mobile number, spoke with patient, and HIPAA verified.  Discussed [facility] Care Management [facility] Transition of care follow up, patient voiced understanding, and is in agreement to follow up.   Patient states is doing alright, has a follow up appointment with primary MD on 04/25/18, and will surgeon on 04/18/18.   Patient states she is able to manage self care and has assistance as needed.  Patient voices understanding of medical diagnosis,surgery, and treatment plan.  States her husband is a Furniture conservator/restorer, she is accessing the following [facility] benefits: outpatient pharmacy, hospital indemnity (not chosen), and family medical leave act (FMLA) not needed at this time.  Patient states she does not have any education material, transition of care, care coordination, disease management, disease monitoring, transportation, community resource, or pharmacy needs at this time.  States she is very appreciative of the follow up and is in agreement to receive [facility] Management information.      Objective: Per KPN (Knowledge Performance Now, point of care tool) and chart review, patient hospitalized 04/07/18 -04/09/18 for Ovarian cyst and pelvic mass.   Status post Exploratory laparotomy with right salpingo-oophorectomy on 04/07/18.   Patient also has a history of hypertension, sciatica, pulmonary embolism, Sleep apnea (uses CPAP),  Spinal stenosis, Thrombophlebitis of leg, right superficial occlusive, and Prothrombin gene mutation.       Assessment: Received [facility] Transition of care referral on 04/08/18.   Transition of care follow up completed, no care management needs, and will proceed with case closure.       Plan:  RNCM will send patient successful outreach letter, [facility] pamphlet, and magnet. RNCM will complete case closure due to follow up completed / no care management  needs.         Marna Weniger H. Annia Friendly, BSN, [place] Management [facility] Telephonic CM Phone: [phone] Fax: [phone]

## 2018-04-16 NOTE — Progress Notes (Signed)
S: Since her last visit she underwent ExLAP/RSO 04/07/18. Dr Denman George took her to surgery due to limited OR availability on my schedule. 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  She returns for an early postop check and pathology review. Some nausea, but no emesis, passing flatus, tolerating po. Normal bladder function with small amount of cramping at end of stream. No fevers.   O:  Vitals:   04/18/18 1355  BP: 112/84  Pulse: (!) 112  Resp: 20  Temp: 98.4 F (36.9 C)  SpO2: 99%    General :  Well developed, 37 y.o., female in no apparent distress HEENT:  Normocephalic/atraumatic, symmetric, EOMI, eyelids normal Neck:   No visible masses.  Respiratory:  Respirations unlabored, no use of accessory muscles CV:   Deferred Breast:  Deferred Musculoskeletal: Normal muscle strength. Abdomen:  Wound CDI No visible masses or protrusion Extremities:  No visible edema or deformities Skin:   Normal inspection Neuro/Psych:  No focal motor deficit, no abnormal mental status. Normal gait. Normal affect. Alert and oriented to person, place, and time    A/P Activity restrictions reinforced. RTC ~1 month for final postop check Continue anticoagulation plan as instructed on discharge.  Cc: Clovia Cuff, MD (Referring Ob/Gyn) Emeterio Reeve, DO (PCP)

## 2018-04-18 ENCOUNTER — Inpatient Hospital Stay: Payer: 59 | Attending: Hematology & Oncology | Admitting: Obstetrics

## 2018-04-18 ENCOUNTER — Encounter: Payer: Self-pay | Admitting: Obstetrics

## 2018-04-18 VITALS — BP 112/84 | HR 112 | Temp 98.4°F | Resp 20 | Ht 66.0 in | Wt 272.0 lb

## 2018-04-18 DIAGNOSIS — Z90722 Acquired absence of ovaries, bilateral: Secondary | ICD-10-CM | POA: Insufficient documentation

## 2018-04-18 DIAGNOSIS — N83201 Unspecified ovarian cyst, right side: Secondary | ICD-10-CM

## 2018-04-18 DIAGNOSIS — D279 Benign neoplasm of unspecified ovary: Secondary | ICD-10-CM | POA: Insufficient documentation

## 2018-04-18 DIAGNOSIS — Z90721 Acquired absence of ovaries, unilateral: Secondary | ICD-10-CM

## 2018-04-18 NOTE — Patient Instructions (Signed)
1. Return in one month for a final wound check 2. Call us if any issues before that time 3. Continue to restrict your activity (no heavy lifting, pulling, pushing)

## 2018-04-25 ENCOUNTER — Ambulatory Visit: Payer: 59 | Admitting: Osteopathic Medicine

## 2018-04-29 ENCOUNTER — Ambulatory Visit: Payer: 59 | Admitting: Obstetrics

## 2018-05-02 ENCOUNTER — Ambulatory Visit: Payer: 59 | Admitting: Osteopathic Medicine

## 2018-05-10 ENCOUNTER — Inpatient Hospital Stay: Payer: 59 | Attending: Hematology & Oncology

## 2018-05-10 ENCOUNTER — Other Ambulatory Visit: Payer: Self-pay

## 2018-05-10 ENCOUNTER — Inpatient Hospital Stay (HOSPITAL_BASED_OUTPATIENT_CLINIC_OR_DEPARTMENT_OTHER): Payer: 59 | Admitting: Family

## 2018-05-10 ENCOUNTER — Ambulatory Visit (HOSPITAL_BASED_OUTPATIENT_CLINIC_OR_DEPARTMENT_OTHER)
Admission: RE | Admit: 2018-05-10 | Discharge: 2018-05-10 | Disposition: A | Discharge status: Home / Self Care | Payer: 59 | Source: Ambulatory Visit | Attending: Family | Admitting: Family

## 2018-05-10 ENCOUNTER — Encounter: Payer: Self-pay | Admitting: Family

## 2018-05-10 VITALS — BP 132/65 | HR 96 | Temp 98.3°F | Resp 19 | Wt 273.4 lb

## 2018-05-10 DIAGNOSIS — F17210 Nicotine dependence, cigarettes, uncomplicated: Secondary | ICD-10-CM | POA: Insufficient documentation

## 2018-05-10 DIAGNOSIS — D508 Other iron deficiency anemias: Secondary | ICD-10-CM

## 2018-05-10 DIAGNOSIS — D6852 Prothrombin gene mutation: Secondary | ICD-10-CM | POA: Diagnosis not present

## 2018-05-10 DIAGNOSIS — I824Y1 Acute embolism and thrombosis of unspecified deep veins of right proximal lower extremity: Secondary | ICD-10-CM

## 2018-05-10 DIAGNOSIS — Z7901 Long term (current) use of anticoagulants: Secondary | ICD-10-CM | POA: Insufficient documentation

## 2018-05-10 DIAGNOSIS — Z79899 Other long term (current) drug therapy: Secondary | ICD-10-CM

## 2018-05-10 DIAGNOSIS — M7989 Other specified soft tissue disorders: Secondary | ICD-10-CM

## 2018-05-10 DIAGNOSIS — I8001 Phlebitis and thrombophlebitis of superficial vessels of right lower extremity: Secondary | ICD-10-CM | POA: Insufficient documentation

## 2018-05-10 DIAGNOSIS — Z86711 Personal history of pulmonary embolism: Secondary | ICD-10-CM

## 2018-05-10 DIAGNOSIS — F1210 Cannabis abuse, uncomplicated: Secondary | ICD-10-CM | POA: Diagnosis not present

## 2018-05-10 LAB — CMP (CANCER CENTER ONLY)
ALBUMIN: 3.7 g/dL (ref 3.5–5.0)
ALT: 40 U/L (ref 0–44)
ANION GAP: 10 (ref 5–15)
AST: 26 U/L (ref 15–41)
Alkaline Phosphatase: 72 U/L (ref 38–126)
BUN: 11 mg/dL (ref 6–20)
CHLORIDE: 106 mmol/L (ref 98–111)
CO2: 26 mmol/L (ref 22–32)
Calcium: 9.9 mg/dL (ref 8.9–10.3)
Creatinine: 0.82 mg/dL (ref 0.44–1.00)
GFR, Est AFR Am: >60 mL/min (ref >60)
GFR, Estimated: >60 mL/min (ref >60)
Glucose, Bld: 142 mg/dL — ABNORMAL HIGH (ref 70–99)
POTASSIUM: 3.9 mmol/L (ref 3.5–5.1)
Sodium: 142 mmol/L (ref 135–145)
Total Bilirubin: 0.2 mg/dL — ABNORMAL LOW (ref 0.3–1.2)
Total Protein: 6.9 g/dL (ref 6.5–8.1)

## 2018-05-10 LAB — CBC WITH DIFFERENTIAL (CANCER CENTER ONLY)
BASOS PCT: 0 %
Basophils Absolute: 0 10*3/uL (ref 0.0–0.1)
EOS PCT: 4 %
Eosinophils Absolute: 0.5 10*3/uL (ref 0.0–0.5)
HCT: 38 % (ref 34.8–46.6)
Hemoglobin: 11.7 g/dL (ref 11.6–15.9)
Lymphocytes Relative: 28 %
Lymphs Abs: 3.4 10*3/uL — ABNORMAL HIGH (ref 0.9–3.3)
MCH: 24.7 pg — ABNORMAL LOW (ref 26.0–34.0)
MCHC: 30.8 g/dL — AB (ref 32.0–36.0)
MCV: 80.3 fL — ABNORMAL LOW (ref 81.0–101.0)
Monocytes Absolute: 0.8 10*3/uL (ref 0.1–0.9)
Monocytes Relative: 6 %
Neutro Abs: 7.7 10*3/uL — ABNORMAL HIGH (ref 1.5–6.5)
Neutrophils Relative %: 62 %
PLATELETS: 344 10*3/uL (ref 145–400)
RBC: 4.73 MIL/uL (ref 3.70–5.32)
RDW: 15.5 % (ref 11.1–15.7)
WBC Count: 12.4 10*3/uL — ABNORMAL HIGH (ref 3.9–10.0)

## 2018-05-10 NOTE — Progress Notes (Signed)
Hematology and Oncology Follow Up Visit  Lorraine Lynch [id number] 1981-06-11 37 y.o. 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 She is doing well but still has some tenderness and swelling in her right lower extremity at times. She is wearing her compression stocking regularly for support which she feels helps.  [place] today showed no evidence of DVT and resolution of the superficial thrombus of the the small saphenous vein.  She was able to have her spinal surgery as well as her right ovary removed due to what turned out to be a benign mass in August and did well with both procedures.  She still has some sciatica down the right leg at times.  Her cycle has been heavy but she has had no other issues with bleeding. No bruising or petechiae.  She is still smoking and is trying to quit.  No fever, chills, n/v, cough, rash, dizziness, SOB, chest pain, palpitations, abdominal pain or changes in bowel or bladder habits.  No lymphadenopathy noted on exam.  She has maintained a good appetite and is staying well hydrated. Her weight is stable.   ECOG Performance Status: 1 - Symptomatic but completely ambulatory  Medications:  Allergies as of 05/10/2018      Reactions   Latex Hives, Itching, Rash   Burning   Tuberculin Ppd Dermatitis, Rash      Medication List        Accurate as of 05/10/18  1:24 PM. Always use your most recent med list.          apixaban 5 MG Tabs tablet Commonly known as:  ELIQUIS Take 1 tablet (5 mg total) by mouth 2 (two) times daily.   enoxaparin 40 MG/0.4ML injection Commonly known as:  LOVENOX Inject 0.4 mLs (40 mg total) into the skin daily.   enoxaparin 40 MG/0.4ML injection Commonly known as:   LOVENOX Inject 0.4 mLs (40 mg total) into the skin daily.   gabapentin 300 MG capsule Commonly known as:  NEURONTIN Take 2 capsules (600 mg total) by mouth 3 (three) times daily.   ibuprofen 200 MG tablet Commonly known as:  ADVIL,MOTRIN Take 400 mg by mouth 2 (two) times daily as needed for headache or moderate pain.   lisinopril 20 MG tablet Commonly known as:  PRINIVIL,ZESTRIL Take 1 tablet (20 mg total) by mouth daily.       Allergies:  Allergies  Allergen Reactions  . Latex Hives, Itching and Rash    Burning   . Tuberculin Ppd Dermatitis and Rash    Past Medical History, Surgical history, Social history, and Family History were reviewed and updated.  Review of Systems: All other 10 point review of systems is negative.   Physical Exam:  vitals were not taken for this visit.   Wt Readings from Last 3 Encounters:  04/18/18 272 lb (123.4 kg)  04/07/18 269 lb (122 kg)  03/29/18 269 lb (122 kg)    Ocular: Sclerae unicteric, pupils equal, round and reactive to light Ear-nose-throat: Oropharynx clear, dentition fair Lymphatic: No cervical, supraclavicular or axillary adenopathy Lungs no rales or rhonchi, good excursion bilaterally Heart regular rate and rhythm, no murmur appreciated Abd soft, nontender, positive bowel sounds, no liver or spleen tip palpated on exam, no fluid wave  MSK no focal spinal tenderness, no joint edema Neuro: non-focal, well-oriented, appropriate affect Breasts: Deferred   Lab Results  Component Value Date   WBC 15.5 (H) 04/08/2018   HGB 10.8 (L) 04/08/2018   HCT 34.9 (L) 04/08/2018   MCV 78.6 04/08/2018   PLT 336 04/08/2018   Lab Results  Component Value Date   FERRITIN 28 03/10/2018   IRON 41 03/10/2018   TIBC 399 03/10/2018   UIBC 357 03/10/2018   IRONPCTSAT 10 (L) 03/10/2018   Lab Results  Component Value Date   RETICCTPCT 1.5 03/10/2018   RBC 4.44 04/08/2018   No results found for: KPAFRELGTCHN, LAMBDASER,  KAPLAMBRATIO No results found for: IGGSERUM, IGA, IGMSERUM No results found for: Odetta Pink, SPEI   Chemistry      Component Value Date/Time   NA 140 04/08/2018 0442   K 4.3 04/08/2018 0442   CL 104 04/08/2018 0442   CO2 27 04/08/2018 0442   BUN 13 04/08/2018 0442   CREATININE 0.63 04/08/2018 0442   CREATININE 0.77 10/15/2017 0751      Component Value Date/Time   CALCIUM 9.0 04/08/2018 0442   ALKPHOS 66 03/29/2018 0958   AST 17 03/29/2018 0958   ALT 26 03/29/2018 0958   BILITOT 0.6 03/29/2018 0958      Impression and Plan: Lorraine Lynch is a very pleasant 37 yo caucasian female with prothrombin gene mutation, single G-20210-A mutation with history of PE diagnosed right after the birth of her last child and most recently an occlusive superficial thrombus of the right small saphenous vein in the right lower extremity. US showed that this superficial thrombus has resolved and there was no evidence of DVT.  She will continue her same regimen of full dose Eliquis for total of 6 months.  We will plan to see her back in another 3 months for follow-up.  She will contact our office with any questions or concerns. We can certainly see her sooner if need be.   Laverna Peace, NP 10/1/20191:24 PM

## 2018-05-11 LAB — LUPUS ANTICOAGULANT PANEL
DRVVT: 36.5 s (ref 0.0–47.0)
PTT Lupus Anticoagulant: 32.4 s (ref 0.0–51.9)

## 2018-05-11 LAB — FERRITIN: Ferritin: 22 ng/mL (ref 11–307)

## 2018-05-11 LAB — IRON AND TIBC
Iron: 20 ug/dL — ABNORMAL LOW (ref 41–142)
Saturation Ratios: 6 % — ABNORMAL LOW (ref 21–57)
TIBC: 359 ug/dL (ref 236–444)
UIBC: 339 ug/dL

## 2018-05-12 ENCOUNTER — Other Ambulatory Visit: Payer: Self-pay | Admitting: Family

## 2018-05-12 ENCOUNTER — Telehealth: Payer: Self-pay | Admitting: *Deleted

## 2018-05-12 DIAGNOSIS — D508 Other iron deficiency anemias: Secondary | ICD-10-CM

## 2018-05-12 NOTE — Telephone Encounter (Addendum)
Patient is aware of results and medication recommendations.   ----- Message from Eliezer Bottom, NP sent at 05/12/2018 10:39 AM EDT ----- Regarding: iron  Iron saturation low, start gentle iron over the counter supplement. Will recheck at next visit. Thank you!  Sarah  ----- Message ----- From: Buel Ream, Lab In [place] Sent: 05/10/2018   1:27 PM EDT To: Eliezer Bottom, NP

## 2018-05-18 ENCOUNTER — Ambulatory Visit (INDEPENDENT_AMBULATORY_CARE_PROVIDER_SITE_OTHER): Payer: 59 | Admitting: Osteopathic Medicine

## 2018-05-18 ENCOUNTER — Encounter: Payer: Self-pay | Admitting: Osteopathic Medicine

## 2018-05-18 ENCOUNTER — Inpatient Hospital Stay: Payer: 59 | Admitting: Obstetrics

## 2018-05-18 VITALS — BP 124/79 | HR 83 | Temp 98.5°F | Wt 271.3 lb

## 2018-05-18 DIAGNOSIS — Z9989 Dependence on other enabling machines and devices: Secondary | ICD-10-CM

## 2018-05-18 DIAGNOSIS — Z23 Encounter for immunization: Secondary | ICD-10-CM

## 2018-05-18 DIAGNOSIS — R7303 Prediabetes: Secondary | ICD-10-CM | POA: Insufficient documentation

## 2018-05-18 DIAGNOSIS — G4733 Obstructive sleep apnea (adult) (pediatric): Secondary | ICD-10-CM

## 2018-05-18 MED ORDER — METFORMIN HCL ER 500 MG PO TB24: 500.0000 mg | ORAL_TABLET | Freq: Every day | ORAL | 0 refills | Status: DC

## 2018-05-18 MED ORDER — METFORMIN HCL ER 500 MG PO TB24: 500.0000 mg | ORAL_TABLET | Freq: Every day | ORAL | 3 refills | Status: DC

## 2018-05-18 NOTE — Progress Notes (Signed)
HPI: Lorraine Lynch is a 37 y.o. female who  has a past medical history of Anemia, Genetic testing, High blood pressure, History of pulmonary embolism (10/14/2017), Pneumonia, PONV (postoperative nausea and vomiting), Pre-diabetes, Prothrombin gene mutation [facility]), Sleep apnea, Spinal stenosis, and Thrombophlebitis of leg, right, superficial (02/2018).  she presents to [facility] today, 05/18/18,  for chief complaint of:  Discuss A1C  Patient here for discussion on prediabetes. Once he is on record: 10/15/17 was 6.1 02/23/18 was 6.1   Patient is accompanied by 71 yo son who is very cute.   Past medical history, surgical history, and family history reviewed.  Current medication list and allergy/intolerance information reviewed.   (See remainder of HPI, ROS, Phys Exam below)    ASSESSMENT/PLAN:   Prediabetes  Need for influenza vaccination - Plan: Flu Vaccine QUAD 6+ mos PF IM (Fluarix Quad PF)  OSA on CPAP   Meds ordered this encounter  Medications  . DISCONTD: metFORMIN (GLUCOPHAGE XR) 500 MG 24 hr tablet    Sig: Take 1 tablet (500 mg total) by mouth daily with breakfast.    Dispense:  90 tablet    Refill:  3  . metFORMIN (GLUCOPHAGE XR) 500 MG 24 hr tablet    Sig: Take 1 tablet (500 mg total) by mouth daily with breakfast.    Dispense:  30 tablet    Refill:  0    Patient Instructions  Our fax # [phone], have your home health company send [place] orders to renew equipment.    Follow-up plan: Return in about 3 months (around 08/18/2018) for recheck A1C.           ############################################ ############################################ ############################################ ############################################    Outpatient Encounter Medications as of 05/18/2018  Medication Sig  . apixaban (ELIQUIS) 5 MG TABS tablet Take 1 tablet (5 mg total) by mouth 2 (two) times daily.  Marland Kitchen gabapentin  (NEURONTIN) 300 MG capsule Take 2 capsules (600 mg total) by mouth 3 (three) times daily.  Marland Kitchen ibuprofen (ADVIL,MOTRIN) 200 MG tablet Take 400 mg by mouth 2 (two) times daily as needed for headache or moderate pain.  Marland Kitchen lisinopril (PRINIVIL,ZESTRIL) 20 MG tablet Take 1 tablet (20 mg total) by mouth daily.   No facility-administered encounter medications on file as of 05/18/2018.    Allergies  Allergen Reactions  . Latex Hives, Itching and Rash    Burning   . Tuberculin Ppd Dermatitis and Rash      Review of Systems:  Constitutional: No recent illness  HEENT: No  headache, no vision change  Cardiac: No  chest pain, No  pressure, No palpitations  Respiratory:  No  shortness of breath. No  Cough  Gastrointestinal: No  abdominal pain, no change on bowel habits  Musculoskeletal: No new myalgia/arthralgia  Skin: No  Rash  Hem/Onc: No  easy bruising/bleeding, No  abnormal lumps/bumps  Neurologic: No  weakness, No  Dizziness  Psychiatric: No  concerns with depression, No  concerns with anxiety  Exam:  BP 124/79 (BP Location: Left Arm, Patient Position: Sitting, Cuff Size: Large)   Pulse 83   Temp 98.5 F (36.9 C) (Oral)   Wt 271 lb 4.8 oz (123.1 kg)   BMI 43.79 kg/m   Constitutional: VS see above. General Appearance: alert, well-developed, well-nourished, NAD  Eyes: Normal lids and conjunctive, non-icteric sclera  Ears, Nose, Mouth, Throat: MMM, Normal external inspection ears/nares/mouth/lips/gums.  Neck: No masses, trachea midline.   Respiratory: Normal respiratory effort.  Musculoskeletal: Gait normal. Symmetric and independent movement of all extremities  Neurological: Normal balance/coordination. No tremor.  Skin: warm, dry, intact.   Psychiatric: Normal judgment/insight. Normal mood and affect. Oriented x3.   Visit summary with medication list and pertinent instructions was printed for patient to review, advised to alert [place] if any changes needed. All  questions at time of visit were answered - patient instructed to contact office with any additional concerns. ER/RTC precautions were reviewed with the patient and understanding verbalized.   Follow-up plan: Return in about 3 months (around 08/18/2018) for recheck A1C.  Note: Total time spent 25 minutes, greater than 50% of the visit was spent face-to-face counseling and coordinating care for the following: The primary encounter diagnosis was Prediabetes. Diagnoses of Need for influenza vaccination and OSA on CPAP were also pertinent to this visit.Marland Kitchen  Please note: voice recognition software was used to produce this document, and typos may escape review. Please contact Dr. Sheppard Coil for any needed clarifications.

## 2018-05-18 NOTE — Patient Instructions (Signed)
Our fax # [phone], have your home health company send [place] orders to renew equipment.

## 2018-06-09 ENCOUNTER — Other Ambulatory Visit: Payer: Self-pay | Admitting: Osteopathic Medicine

## 2018-06-09 NOTE — Telephone Encounter (Signed)
PEC not do refills for this provider. 

## 2018-08-01 ENCOUNTER — Ambulatory Visit: Payer: 59 | Admitting: Obstetrics & Gynecology

## 2018-08-01 DIAGNOSIS — Z09 Encounter for follow-up examination after completed treatment for conditions other than malignant neoplasm: Secondary | ICD-10-CM

## 2018-08-18 ENCOUNTER — Telehealth: Payer: Self-pay | Admitting: Osteopathic Medicine

## 2018-08-18 ENCOUNTER — Ambulatory Visit: Payer: 59 | Admitting: Osteopathic Medicine

## 2018-08-18 NOTE — Telephone Encounter (Signed)
Please call patient: she has had 2 no-shows to appointments at this office, see below. Our clinic policy states that if a patient does not show up to appointments three times in a year, this results in dismissal from the practice. I'm not dismissing her at this point, just want to make sure everything is ok and remind her of the policy. If she has any additional no-shows, we will be have to follow policy.    NO SHOW 04/25/18 Dr A NO SHOW 08/18/18 Dr Loni Muse

## 2018-08-22 ENCOUNTER — Telehealth: Payer: Self-pay | Admitting: Family

## 2018-08-22 NOTE — Telephone Encounter (Signed)
Appointments adjusted letter/calendar mailed  °

## 2018-08-31 ENCOUNTER — Other Ambulatory Visit: Payer: Self-pay | Admitting: Family

## 2018-09-06 ENCOUNTER — Ambulatory Visit: Payer: Medicaid Other | Admitting: Family

## 2018-09-06 ENCOUNTER — Other Ambulatory Visit: Payer: 59

## 2018-09-06 ENCOUNTER — Ambulatory Visit: Payer: 59 | Admitting: Family

## 2018-09-06 ENCOUNTER — Other Ambulatory Visit: Payer: Medicaid Other

## 2018-09-09 ENCOUNTER — Other Ambulatory Visit: Payer: Medicaid Other

## 2018-09-09 ENCOUNTER — Ambulatory Visit: Payer: Self-pay | Admitting: Family

## 2018-09-13 ENCOUNTER — Telehealth: Payer: Self-pay | Admitting: Osteopathic Medicine

## 2018-09-13 ENCOUNTER — Ambulatory Visit: Payer: Medicaid Other | Admitting: Osteopathic Medicine

## 2018-09-13 ENCOUNTER — Other Ambulatory Visit: Payer: Medicaid Other

## 2018-09-13 NOTE — Telephone Encounter (Signed)
Dismissal letter generated after multiple no-shows

## 2018-09-19 ENCOUNTER — Inpatient Hospital Stay (HOSPITAL_BASED_OUTPATIENT_CLINIC_OR_DEPARTMENT_OTHER): Payer: BLUE CROSS/BLUE SHIELD | Admitting: Family

## 2018-09-19 ENCOUNTER — Telehealth: Payer: Self-pay | Admitting: Hematology & Oncology

## 2018-09-19 ENCOUNTER — Encounter: Payer: Self-pay | Admitting: Family

## 2018-09-19 ENCOUNTER — Inpatient Hospital Stay: Payer: BLUE CROSS/BLUE SHIELD | Attending: Family

## 2018-09-19 ENCOUNTER — Ambulatory Visit (INDEPENDENT_AMBULATORY_CARE_PROVIDER_SITE_OTHER): Payer: BLUE CROSS/BLUE SHIELD | Admitting: Osteopathic Medicine

## 2018-09-19 ENCOUNTER — Other Ambulatory Visit: Payer: Self-pay

## 2018-09-19 VITALS — BP 89/60 | HR 86 | Temp 98.5°F | Wt 266.3 lb

## 2018-09-19 VITALS — BP 100/71 | HR 116 | Temp 98.4°F | Resp 18 | Ht 66.0 in | Wt 267.0 lb

## 2018-09-19 DIAGNOSIS — D508 Other iron deficiency anemias: Secondary | ICD-10-CM

## 2018-09-19 DIAGNOSIS — I824Y1 Acute embolism and thrombosis of unspecified deep veins of right proximal lower extremity: Secondary | ICD-10-CM

## 2018-09-19 DIAGNOSIS — D6852 Prothrombin gene mutation: Secondary | ICD-10-CM

## 2018-09-19 DIAGNOSIS — I10 Essential (primary) hypertension: Secondary | ICD-10-CM

## 2018-09-19 DIAGNOSIS — Z7901 Long term (current) use of anticoagulants: Secondary | ICD-10-CM | POA: Diagnosis not present

## 2018-09-19 DIAGNOSIS — R7303 Prediabetes: Secondary | ICD-10-CM

## 2018-09-19 DIAGNOSIS — Z86711 Personal history of pulmonary embolism: Secondary | ICD-10-CM

## 2018-09-19 LAB — CMP (CANCER CENTER ONLY)
ALBUMIN: 4.3 g/dL (ref 3.5–5.0)
ALK PHOS: 65 U/L (ref 38–126)
ALT: 27 U/L (ref 0–44)
ANION GAP: 10 (ref 5–15)
AST: 19 U/L (ref 15–41)
BILIRUBIN TOTAL: 0.4 mg/dL (ref 0.3–1.2)
BUN: 13 mg/dL (ref 6–20)
CALCIUM: 9.8 mg/dL (ref 8.9–10.3)
CO2: 28 mmol/L (ref 22–32)
Chloride: 102 mmol/L (ref 98–111)
Creatinine: 0.81 mg/dL (ref 0.44–1.00)
GFR, Est AFR Am: >60 mL/min (ref >60)
GFR, Estimated: >60 mL/min (ref >60)
GLUCOSE: 129 mg/dL — AB (ref 70–99)
Potassium: 3.3 mmol/L — ABNORMAL LOW (ref 3.5–5.1)
SODIUM: 140 mmol/L (ref 135–145)
TOTAL PROTEIN: 6.6 g/dL (ref 6.5–8.1)

## 2018-09-19 LAB — CBC WITH DIFFERENTIAL (CANCER CENTER ONLY)
ABS IMMATURE GRANULOCYTES: 0.07 10*3/uL (ref 0.00–0.07)
BASOS ABS: 0.1 10*3/uL (ref 0.0–0.1)
Basophils Relative: 0 %
Eosinophils Absolute: 0.3 10*3/uL (ref 0.0–0.5)
Eosinophils Relative: 2 %
HEMATOCRIT: 37.1 % (ref 36.0–46.0)
HEMOGLOBIN: 10.9 g/dL — AB (ref 12.0–15.0)
IMMATURE GRANULOCYTES: 1 %
Lymphocytes Relative: 23 %
Lymphs Abs: 2.9 10*3/uL (ref 0.7–4.0)
MCH: 22.3 pg — ABNORMAL LOW (ref 26.0–34.0)
MCHC: 29.4 g/dL — ABNORMAL LOW (ref 30.0–36.0)
MCV: 76 fL — ABNORMAL LOW (ref 80.0–100.0)
MONOS PCT: 7 %
Monocytes Absolute: 0.8 10*3/uL (ref 0.1–1.0)
NEUTROS PCT: 67 %
NRBC: 0 % (ref 0.0–0.2)
Neutro Abs: 8.2 10*3/uL — ABNORMAL HIGH (ref 1.7–7.7)
Platelet Count: 389 10*3/uL (ref 150–400)
RBC: 4.88 MIL/uL (ref 3.87–5.11)
RDW: 15.7 % — ABNORMAL HIGH (ref 11.5–15.5)
WBC Count: 12.3 10*3/uL — ABNORMAL HIGH (ref 4.0–10.5)

## 2018-09-19 LAB — POCT GLYCOSYLATED HEMOGLOBIN (HGB A1C): Hemoglobin A1C: 6.2 % — AB (ref 4.0–5.6)

## 2018-09-19 MED ORDER — APIXABAN 2.5 MG PO TABS: 2.5000 mg | ORAL_TABLET | Freq: Two times a day (BID) | ORAL | 6 refills | Status: DC

## 2018-09-19 MED ORDER — LISINOPRIL 20 MG PO TABS: 20.0000 mg | ORAL_TABLET | Freq: Every day | ORAL | 3 refills | Status: DC

## 2018-09-19 NOTE — Patient Instructions (Addendum)
Last cholesterol check 10/2017. I put orders in to recheck cholesterol at your convenience, otherwise all labs are looking ok and we can follow up in 6 months for official Annual Physical.   If you get a letter from our office regarding dismissal for multiple no-shows, please contact our office and ask to speak to our practice administrator Abigail Butts to ask about recalling this. I'll let her know as well, but if any additional no-shows though in the next year, I won't be able to help you!

## 2018-09-19 NOTE — Progress Notes (Signed)
Hematology and Oncology Follow Up Visit  Lorraine Lynch [id number] [birth year] 38 y.o. 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 She is doing well and staying busy with work and her sweet family. She denies fatigue at this time.  She is sleeping well at night.  She has intermittent swelling in her right foot and ankle and wears a compression sticking in the evenings. I advised she wear during the day as well to help minimize the swelling.   She states that her cycles is still quite heavy. No other episodes of bleeding noted. No bruising or petechiae.  No issue with infection. No fever, chills, n/v, cough, rash, dizziness, SOB, chest pain, palpitations, abdominal pain or changes in bowel or bladder habits.  No numbness or tingling in her extremities.  No lymphadenopathy noted on exam.  She is walking some for exercise.  She has maintained a good appetite and is staying well hydrated. Her weight is stable.   ECOG Performance Status: 1 - Symptomatic but completely ambulatory  Medications:  Allergies as of 09/19/2018      Reactions   Latex Hives, Itching, Rash, Other (See Comments)   Burning   Tuberculin Ppd Dermatitis, Rash      Medication List       Accurate as of September 19, 2018 11:19 AM. Always use your most recent med list.        apixaban 5 MG Tabs tablet Commonly known as:  ELIQUIS Take 1 tablet (5 mg total) by mouth 2 (two) times daily.   ibuprofen 200 MG tablet Commonly known as:  ADVIL,MOTRIN Take 400 mg by mouth 2 (two) times daily as needed for headache or moderate pain.   lisinopril 20 MG tablet Commonly known as:  PRINIVIL,ZESTRIL Take 1 tablet (20 mg total) by mouth daily.   metFORMIN 500 MG 24 hr  tablet Commonly known as:  GLUCOPHAGE-XR TAKE 1 TABLET BY MOUTH EVERY DAY WITH BREAKFAST       Allergies:  Allergies  Allergen Reactions  . Latex Hives, Itching, Rash and Other (See Comments)    Burning   . Tuberculin Ppd Dermatitis and Rash    Past Medical History, Surgical history, Social history, and Family History were reviewed and updated.  Review of Systems: All other 10 point review of systems is negative.   Physical Exam:  vitals were not taken for this visit.   Wt Readings from Last 3 Encounters:  09/19/18 266 lb 4.8 oz (120.8 kg)  05/18/18 271 lb 4.8 oz (123.1 kg)  05/10/18 273 lb 6.4 oz (124 kg)    Ocular: Sclerae unicteric, pupils equal, round and reactive to light Ear-nose-throat: Oropharynx clear, dentition fair Lymphatic: No cervical, supraclavicular or axillary adenopathy Lungs no rales or rhonchi, good excursion bilaterally Heart regular rate and rhythm, no murmur appreciated Abd soft, nontender, positive bowel sounds, no liver or spleen tip palpated on exam, no fluid wave  MSK no focal spinal tenderness, no joint edema Neuro: non-focal, well-oriented, appropriate affect Breasts: Deferred   Lab Results  Component Value Date   WBC 12.3 (H) 09/19/2018   HGB 10.9 (L) 09/19/2018   HCT 37.1 09/19/2018   MCV 76.0 (L) 09/19/2018   PLT 389 09/19/2018   Lab Results  Component Value  Date   FERRITIN 22 05/10/2018   IRON 20 (L) 05/10/2018   TIBC 359 05/10/2018   UIBC 339 05/10/2018   IRONPCTSAT 6 (L) 05/10/2018   Lab Results  Component Value Date   RETICCTPCT 1.5 03/10/2018   RBC 4.88 09/19/2018   No results found for: KPAFRELGTCHN, LAMBDASER, KAPLAMBRATIO No results found for: IGGSERUM, IGA, IGMSERUM No results found for: Odetta Pink, SPEI   Chemistry      Component Value Date/Time   NA 142 05/10/2018 1312   K 3.9 05/10/2018 1312   CL 106 05/10/2018 1312   CO2 26 05/10/2018 1312   BUN  11 05/10/2018 1312   CREATININE 0.82 05/10/2018 1312   CREATININE 0.77 10/15/2017 0751      Component Value Date/Time   CALCIUM 9.9 05/10/2018 1312   ALKPHOS 72 05/10/2018 1312   AST 26 05/10/2018 1312   ALT 40 05/10/2018 1312   BILITOT 0.2 (L) 05/10/2018 1312       Impression and Plan: Lorraine Lynch is a very pleasant 38 yo caucasian female with prothrombin gene mutation, single G-20210-A mutation with history of PE diagnosed right after the birth of her last child in 2015 and an occlusive superficial thrombus of the right small saphenous vein in the right lower extremity in 2019 (resolved). She will now go down to maintenance Eliquis 2.5 mg PO BID.  We will plan to see her back in another 6 months for follow-up.  She will contact our office with any questions or concerns. We can certainly see her sooner if need be.   Laverna Peace, NP 2/10/202011:19 AM

## 2018-09-19 NOTE — Progress Notes (Signed)
HPI: Lorraine Lynch is a 38 y.o. female who  has a past medical history of Anemia, Genetic testing, High blood pressure, History of pulmonary embolism (10/14/2017), Pneumonia, PONV (postoperative nausea and vomiting), Pre-diabetes, Prothrombin gene mutation [facility]), Sleep apnea, Spinal stenosis, and Thrombophlebitis of leg, right, superficial (02/2018).  she presents to [facility] today, 09/20/18,  for chief complaint of:  Prediabetes f/u - A1C check today HTN f/u   A1C today 6.2% Taking metformin XR 500 mg daily   HTN: No CP/SOB. Taking Lisinopril 20 mg daily  BP Readings from Last 3 Encounters:  09/19/18 100/71  09/19/18 (!) 89/60  05/18/18 124/79    Has achieved 5 lbs intentional weight loss since last visit  Wt Readings from Last 3 Encounters:  09/19/18 267 lb (121.1 kg)  09/19/18 266 lb 4.8 oz (120.8 kg)  05/18/18 271 lb 4.8 oz (123.1 kg)     Last labs 05/2018 no major concerns.       At today's visit 09/20/18 ... PMH, PSH, FH reviewed and updated as needed.  Current medication list and allergy/intolerance hx reviewed and updated as needed. (See remainder of HPI, ROS, Phys Exam below)     Results for orders placed or performed in visit on 09/19/18 (from the past 72 hour(s))  POCT HgB A1C     Status: Abnormal   Collection Time: 09/19/18  8:31 AM  Result Value Ref Range   Hemoglobin A1C 6.2 (A) 4.0 - 5.6 %   HbA1c POC (<> result, manual entry)     HbA1c, POC (prediabetic range)     HbA1c, POC (controlled diabetic range)            ASSESSMENT/PLAN: The primary encounter diagnosis was Prediabetes. A diagnosis of Essential hypertension was also pertinent to this visit.   Orders Placed This Encounter  Procedures  . Lipid panel  . POCT HgB A1C     Meds ordered this encounter  Medications  . lisinopril (PRINIVIL,ZESTRIL) 20 MG tablet    Sig: Take 1 tablet (20 mg total) by mouth daily.    Dispense:  90 tablet   Refill:  3    Patient Instructions  Last cholesterol check 10/2017. I put orders in to recheck cholesterol at your convenience, otherwise all labs are looking ok and we can follow up in 6 months for official Annual Physical.   If you get a letter from our office regarding dismissal for multiple no-shows, please contact our office and ask to speak to our practice administrator Abigail Butts to ask about recalling this. I'll let her know as well, but if any additional no-shows though in the next year, I won't be able to help you!        Follow-up plan: Return in about 6 months (around 03/20/2019) for [place] - see me sooner if needed.                                                 ################################################# ################################################# ################################################# #################################################    Current Meds  Medication Sig  . ibuprofen (ADVIL,MOTRIN) 200 MG tablet Take 400 mg by mouth 2 (two) times daily as needed for headache or moderate pain.  Marland Kitchen lisinopril (PRINIVIL,ZESTRIL) 20 MG tablet Take 1 tablet (20 mg total) by mouth daily.  . metFORMIN (GLUCOPHAGE-XR) 500 MG  24 hr tablet TAKE 1 TABLET BY MOUTH EVERY DAY WITH BREAKFAST  . [DISCONTINUED] apixaban (ELIQUIS) 5 MG TABS tablet Take 1 tablet (5 mg total) by mouth 2 (two) times daily.  . [DISCONTINUED] lisinopril (PRINIVIL,ZESTRIL) 20 MG tablet Take 1 tablet (20 mg total) by mouth daily.    Allergies  Allergen Reactions  . Latex Hives, Itching, Rash and Other (See Comments)    Burning   . Tuberculin Ppd Dermatitis and Rash       Review of Systems:  Constitutional: No recent illness  Cardiac: No  chest pain, No  pressure, No palpitations  Respiratory:  No  shortness of breath. No  Cough  Gastrointestinal: No  abdominal pain  Neurologic: No  weakness, No   Dizziness  Psychiatric: No  concerns with depression, No  concerns with anxiety  Exam:  BP (!) 89/60 (BP Location: Left Arm, Patient Position: Sitting, Cuff Size: Large)   Pulse 86   Temp 98.5 F (36.9 C) (Oral)   Wt 266 lb 4.8 oz (120.8 kg)   BMI 42.98 kg/m   Constitutional: VS see above. General Appearance: alert, well-developed, well-nourished, NAD  Eyes: Normal lids and conjunctive, non-icteric sclera  Ears, Nose, Mouth, Throat: MMM, Normal external inspection ears/nares/mouth/lips/gums.  Neck: No masses, trachea midline.   Respiratory: Normal respiratory effort. no wheeze, no rhonchi, no rales  Cardiovascular: S1/S2 normal, no murmur, no rub/gallop auscultated. RRR.   Musculoskeletal: Gait normal. Symmetric and independent movement of all extremities  Neurological: Normal balance/coordination. No tremor.  Skin: warm, dry, intact.   Psychiatric: Normal judgment/insight. Normal mood and affect. Oriented x3.       Visit summary with medication list and pertinent instructions was printed for patient to review, patient was advised to alert [place] if any updates are needed. All questions at time of visit were answered - patient instructed to contact office with any additional concerns. ER/RTC precautions were reviewed with the patient and understanding verbalized.   Note: Total time spent 15 minutes, greater than 50% of the visit was spent face-to-face counseling and coordinating care for the following: The primary encounter diagnosis was Prediabetes. A diagnosis of Essential hypertension was also pertinent to this visit.Marland Kitchen  Please note: voice recognition software was used to produce this document, and typos may escape review. Please contact Dr. Sheppard Coil for any needed clarifications.    Follow up plan: Return in about 6 months (around 03/20/2019) for ANNUAL PHYSICAL W/ LABS AND A1C FOR PREDIABETES - see me sooner if needed.

## 2018-09-19 NOTE — Telephone Encounter (Signed)
Appointments scheduled letter/calendar mailed per 2/10 los

## 2018-09-20 ENCOUNTER — Encounter: Payer: Self-pay | Admitting: Osteopathic Medicine

## 2018-09-20 LAB — FERRITIN: Ferritin: 18 ng/mL (ref 11–307)

## 2018-09-20 LAB — LUPUS ANTICOAGULANT PANEL
DRVVT: 62 s — AB (ref 0.0–47.0)
PTT LA: 36.2 s (ref 0.0–51.9)

## 2018-09-20 LAB — DRVVT MIX: dRVVT Mix: 48.2 s — ABNORMAL HIGH (ref 0.0–47.0)

## 2018-09-20 LAB — IRON AND TIBC
Iron: 21 ug/dL — ABNORMAL LOW (ref 41–142)
Saturation Ratios: 6 % — ABNORMAL LOW (ref 21–57)
TIBC: 369 ug/dL (ref 236–444)
UIBC: 348 ug/dL (ref 120–384)

## 2018-09-20 LAB — DRVVT CONFIRM: dRVVT Confirm: 1.2 ratio (ref 0.8–1.2)

## 2018-09-26 ENCOUNTER — Encounter: Payer: Self-pay | Admitting: Family

## 2018-09-26 ENCOUNTER — Encounter: Payer: Self-pay | Admitting: Osteopathic Medicine

## 2018-09-26 DIAGNOSIS — Z86711 Personal history of pulmonary embolism: Secondary | ICD-10-CM

## 2018-09-26 DIAGNOSIS — I824Y1 Acute embolism and thrombosis of unspecified deep veins of right proximal lower extremity: Secondary | ICD-10-CM

## 2018-09-26 DIAGNOSIS — D6852 Prothrombin gene mutation: Secondary | ICD-10-CM

## 2018-09-26 MED ORDER — APIXABAN 2.5 MG PO TABS: 2.5000 mg | ORAL_TABLET | Freq: Two times a day (BID) | ORAL | 6 refills | Status: DC

## 2018-09-26 MED ORDER — LISINOPRIL 20 MG PO TABS: 20.0000 mg | ORAL_TABLET | Freq: Every day | ORAL | 3 refills | Status: DC

## 2018-09-26 NOTE — Addendum Note (Signed)
Addended by: Alena Bills R on: 09/26/2018 02:12 PM   Modules accepted: Orders

## 2018-11-19 ENCOUNTER — Encounter: Payer: Self-pay | Admitting: Osteopathic Medicine

## 2018-11-21 MED ORDER — METFORMIN HCL ER 500 MG PO TB24: 500.0000 mg | ORAL_TABLET | Freq: Every day | ORAL | 0 refills | Status: DC

## 2019-01-03 ENCOUNTER — Encounter: Payer: Self-pay | Admitting: Osteopathic Medicine

## 2019-01-18 DIAGNOSIS — Z20828 Contact with and (suspected) exposure to other viral communicable diseases: Secondary | ICD-10-CM | POA: Diagnosis not present

## 2019-02-04 ENCOUNTER — Other Ambulatory Visit: Payer: Self-pay | Admitting: Osteopathic Medicine

## 2019-02-15 ENCOUNTER — Encounter: Payer: Self-pay | Admitting: Osteopathic Medicine

## 2019-03-20 ENCOUNTER — Encounter: Payer: BLUE CROSS/BLUE SHIELD | Admitting: Osteopathic Medicine

## 2019-03-20 ENCOUNTER — Ambulatory Visit: Payer: BLUE CROSS/BLUE SHIELD | Admitting: Hematology & Oncology

## 2019-03-20 ENCOUNTER — Ambulatory Visit: Payer: BLUE CROSS/BLUE SHIELD | Admitting: Osteopathic Medicine

## 2019-03-20 ENCOUNTER — Other Ambulatory Visit: Payer: BLUE CROSS/BLUE SHIELD

## 2019-04-06 ENCOUNTER — Other Ambulatory Visit: Payer: Self-pay

## 2019-04-06 ENCOUNTER — Inpatient Hospital Stay (HOSPITAL_BASED_OUTPATIENT_CLINIC_OR_DEPARTMENT_OTHER): Payer: BC Managed Care – PPO | Admitting: Hematology & Oncology

## 2019-04-06 ENCOUNTER — Inpatient Hospital Stay: Payer: BC Managed Care – PPO | Attending: Hematology & Oncology

## 2019-04-06 ENCOUNTER — Encounter: Payer: Self-pay | Admitting: Hematology & Oncology

## 2019-04-06 VITALS — BP 149/81 | HR 110 | Temp 97.7°F | Resp 19 | Ht 66.0 in | Wt 278.1 lb

## 2019-04-06 DIAGNOSIS — I824Y1 Acute embolism and thrombosis of unspecified deep veins of right proximal lower extremity: Secondary | ICD-10-CM

## 2019-04-06 DIAGNOSIS — D508 Other iron deficiency anemias: Secondary | ICD-10-CM

## 2019-04-06 DIAGNOSIS — D6852 Prothrombin gene mutation: Secondary | ICD-10-CM | POA: Diagnosis not present

## 2019-04-06 DIAGNOSIS — G629 Polyneuropathy, unspecified: Secondary | ICD-10-CM | POA: Insufficient documentation

## 2019-04-06 DIAGNOSIS — Z7901 Long term (current) use of anticoagulants: Secondary | ICD-10-CM | POA: Diagnosis not present

## 2019-04-06 DIAGNOSIS — I8001 Phlebitis and thrombophlebitis of superficial vessels of right lower extremity: Secondary | ICD-10-CM | POA: Diagnosis not present

## 2019-04-06 DIAGNOSIS — Z7984 Long term (current) use of oral hypoglycemic drugs: Secondary | ICD-10-CM | POA: Diagnosis not present

## 2019-04-06 DIAGNOSIS — Z86711 Personal history of pulmonary embolism: Secondary | ICD-10-CM

## 2019-04-06 DIAGNOSIS — E119 Type 2 diabetes mellitus without complications: Secondary | ICD-10-CM | POA: Insufficient documentation

## 2019-04-06 DIAGNOSIS — M7989 Other specified soft tissue disorders: Secondary | ICD-10-CM | POA: Insufficient documentation

## 2019-04-06 LAB — CBC WITH DIFFERENTIAL (CANCER CENTER ONLY)
Abs Immature Granulocytes: 0.07 10*3/uL (ref 0.00–0.07)
Basophils Absolute: 0 10*3/uL (ref 0.0–0.1)
Basophils Relative: 0 %
Eosinophils Absolute: 0.3 10*3/uL (ref 0.0–0.5)
Eosinophils Relative: 2 %
HCT: 37.3 % (ref 36.0–46.0)
Hemoglobin: 11 g/dL — ABNORMAL LOW (ref 12.0–15.0)
Immature Granulocytes: 1 %
Lymphocytes Relative: 25 %
Lymphs Abs: 3.3 10*3/uL (ref 0.7–4.0)
MCH: 23.1 pg — ABNORMAL LOW (ref 26.0–34.0)
MCHC: 29.5 g/dL — ABNORMAL LOW (ref 30.0–36.0)
MCV: 78.4 fL — ABNORMAL LOW (ref 80.0–100.0)
Monocytes Absolute: 1 10*3/uL (ref 0.1–1.0)
Monocytes Relative: 8 %
Neutro Abs: 8.3 10*3/uL — ABNORMAL HIGH (ref 1.7–7.7)
Neutrophils Relative %: 64 %
Platelet Count: 369 10*3/uL (ref 150–400)
RBC: 4.76 MIL/uL (ref 3.87–5.11)
RDW: 15.9 % — ABNORMAL HIGH (ref 11.5–15.5)
WBC Count: 13 10*3/uL — ABNORMAL HIGH (ref 4.0–10.5)
nRBC: 0 % (ref 0.0–0.2)

## 2019-04-06 LAB — CMP (CANCER CENTER ONLY)
ALT: 41 U/L (ref 0–44)
AST: 24 U/L (ref 15–41)
Albumin: 4.3 g/dL (ref 3.5–5.0)
Alkaline Phosphatase: 71 U/L (ref 38–126)
Anion gap: 12 (ref 5–15)
BUN: 16 mg/dL (ref 6–20)
CO2: 29 mmol/L (ref 22–32)
Calcium: 9.3 mg/dL (ref 8.9–10.3)
Chloride: 102 mmol/L (ref 98–111)
Creatinine: 0.86 mg/dL (ref 0.44–1.00)
GFR, Est AFR Am: >60 mL/min (ref >60)
GFR, Estimated: >60 mL/min (ref >60)
Glucose, Bld: 124 mg/dL — ABNORMAL HIGH (ref 70–99)
Potassium: 3.7 mmol/L (ref 3.5–5.1)
Sodium: 143 mmol/L (ref 135–145)
Total Bilirubin: 0.3 mg/dL (ref 0.3–1.2)
Total Protein: 7.2 g/dL (ref 6.5–8.1)

## 2019-04-06 NOTE — Progress Notes (Signed)
Hematology and Oncology Follow Up Visit  [name] 09/09/1980 38 y.o. 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 Friends is here today for follow-up.  So far, she is doing quite well.  The masks really do not bother her with respect to the coronavirus.  She really does not mind wearing the mask.  Apparently, her 2 children also enjoyed wearing the mask.  She is doing okay on the Eliquis.  She does have some leg swelling.  This is chronic.  I do not think this is postphlebitic type syndrome.  I think this probably is from her weight.  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  She cannot feel part of her right leg.  She says it is hard to get stockings on her legs.  She has had no cough or shortness of breath.  He has had no issues with her monthly cycles.  She has had no change in bowel or bladder habits.  She is still working.  She is quite busy right now.  She has had no headache.  There is been no rashes.  Overall, her performance status is ECOG 0.    Medications:  Allergies as of 04/06/2019      Reactions   Latex Hives, Itching, Rash, Other (See Comments)   Burning   Tuberculin Ppd Dermatitis, Rash      Medication List       Accurate as of April 06, 2019  4:11 PM. If you have any questions, ask your nurse or doctor.        apixaban 2.5 MG Tabs tablet Commonly known as: ELIQUIS Take 1 tablet (2.5 mg total) by mouth 2 (two) times daily.   ibuprofen 200 MG tablet Commonly known as: ADVIL Take 400 mg by mouth 2 (two) times daily as needed for headache or moderate pain.   lisinopril 10 MG tablet Commonly known as: ZESTRIL Take 1 tablet by mouth 2 (two) times daily. What changed: Another medication with the same name  was removed. Continue taking this medication, and follow the directions you see here. Changed by: Volanda Napoleon, MD   metFORMIN 500 MG 24 hr tablet Commonly known as: GLUCOPHAGE-XR TAKE 1 TABLET BY MOUTH EVERY DAY WITH BREAKFAST       Allergies:  Allergies  Allergen Reactions  . Latex Hives, Itching, Rash and Other (See Comments)    Burning   . Tuberculin Ppd Dermatitis and Rash    Past Medical History, Surgical history, Social history, and Family History were reviewed and updated.  Review of Systems: Review of Systems  Constitutional: Negative.   HENT: Negative.   Eyes: Negative.   Respiratory: Negative.   Cardiovascular: Positive for leg swelling.  Gastrointestinal: Negative.   Genitourinary: Negative.   Musculoskeletal: Negative.   Skin: Negative.   Neurological: Negative.   Endo/Heme/Allergies: Negative.   Psychiatric/Behavioral: Negative.      Physical Exam:  height is 5\' 6"  (1.676 m) and weight is 278 lb 1.9 oz (126.2 kg). Her temporal temperature is 97.7 F (36.5 C). Her blood pressure is 149/81 (abnormal) and her pulse is 110 (abnormal). Her respiration is 19 and oxygen saturation is 99%.   Wt Readings from Last 3 Encounters:  04/06/19 278 lb 1.9 oz (126.2  kg)  09/19/18 267 lb (121.1 kg)  09/19/18 266 lb 4.8 oz (120.8 kg)    Physical Exam Vitals signs reviewed.  HENT:     Head: Normocephalic and atraumatic.  Eyes:     Pupils: Pupils are equal, round, and reactive to light.  Neck:     Musculoskeletal: Normal range of motion.  Cardiovascular:     Rate and Rhythm: Normal rate and regular rhythm.     Heart sounds: Normal heart sounds.  Pulmonary:     Effort: Pulmonary effort is normal.     Breath sounds: Normal breath sounds.  Abdominal:     General: Bowel sounds are normal.     Palpations: Abdomen is soft.  Musculoskeletal: Normal range of motion.        General: No tenderness or deformity.     Comments: In her lower extremities, she does have  some slight pitting edema.  This is mild.  She has good pulses.  She has good range of motion of her joints.  There is no venous cord in her legs.  Lymphadenopathy:     Cervical: No cervical adenopathy.  Skin:    General: Skin is warm and dry.     Findings: No erythema or rash.  Neurological:     Mental Status: She is alert and oriented to person, place, and time.  Psychiatric:        Behavior: Behavior normal.        Thought Content: Thought content normal.        Judgment: Judgment normal.      Lab Results  Component Value Date   WBC 13.0 (H) 04/06/2019   HGB 11.0 (L) 04/06/2019   HCT 37.3 04/06/2019   MCV 78.4 (L) 04/06/2019   PLT 369 04/06/2019   Lab Results  Component Value Date   FERRITIN 18 09/19/2018   IRON 21 (L) 09/19/2018   TIBC 369 09/19/2018   UIBC 348 09/19/2018   IRONPCTSAT 6 (L) 09/19/2018   Lab Results  Component Value Date   RETICCTPCT 1.5 03/10/2018   RBC 4.76 04/06/2019   No results found for: KPAFRELGTCHN, LAMBDASER, KAPLAMBRATIO No results found for: IGGSERUM, IGA, IGMSERUM No results found for: Odetta Pink, SPEI   Chemistry      Component Value Date/Time   NA 143 04/06/2019 1514   K 3.7 04/06/2019 1514   CL 102 04/06/2019 1514   CO2 29 04/06/2019 1514   BUN 16 04/06/2019 1514   CREATININE 0.86 04/06/2019 1514   CREATININE 0.77 10/15/2017 0751      Component Value Date/Time   CALCIUM 9.3 04/06/2019 1514   ALKPHOS 71 04/06/2019 1514   AST 24 04/06/2019 1514   ALT 41 04/06/2019 1514   BILITOT 0.3 04/06/2019 1514       Impression and Plan: [name] is a very pleasant 38 yo caucasian female with prothrombin gene mutation, single G-20210-A mutation with history of PE diagnosed right after the birth of her last child in 60.  She was treated for this.  She was then off anticoagulation.  She then developed a superficial thrombus in the right leg.  This was back in 2019.  She was  then placed on anticoagulation with Eliquis.  A follow-up Doppler in late 2019 showed resolution of the thrombus.  Because of the prothrombin gene mutation, and the fact that she has had recurrence of the thrombotic disease, she probably needs to be on long-term anticoagulation.  We will  keep her on the low-dose Eliquis.  She is doing well on this and I think it will be effective.  We will get her back in 6 more months.  I think this is reasonable.  It sounds like she will have a very good weekend as she and her children will have a [employer] movie marathon at their house.    Volanda Napoleon, MD 8/27/20204:11 PM

## 2019-04-07 LAB — LUPUS ANTICOAGULANT PANEL
DRVVT: 33.1 s (ref 0.0–47.0)
PTT Lupus Anticoagulant: 30.3 s (ref 0.0–51.9)

## 2019-04-07 LAB — FERRITIN: Ferritin: 25 ng/mL (ref 11–307)

## 2019-04-07 LAB — IRON AND TIBC
Iron: 20 ug/dL — ABNORMAL LOW (ref 41–142)
Saturation Ratios: 5 % — ABNORMAL LOW (ref 21–57)
TIBC: 371 ug/dL (ref 236–444)
UIBC: 351 ug/dL (ref 120–384)

## 2019-04-11 ENCOUNTER — Encounter: Payer: Self-pay | Admitting: Osteopathic Medicine

## 2019-04-11 ENCOUNTER — Ambulatory Visit (INDEPENDENT_AMBULATORY_CARE_PROVIDER_SITE_OTHER): Payer: BC Managed Care – PPO | Admitting: Osteopathic Medicine

## 2019-04-11 ENCOUNTER — Other Ambulatory Visit: Payer: Self-pay

## 2019-04-11 VITALS — BP 116/80 | HR 92 | Temp 99.2°F | Wt 279.2 lb

## 2019-04-11 DIAGNOSIS — Z23 Encounter for immunization: Secondary | ICD-10-CM | POA: Diagnosis not present

## 2019-04-11 DIAGNOSIS — R7303 Prediabetes: Secondary | ICD-10-CM

## 2019-04-11 DIAGNOSIS — Z86711 Personal history of pulmonary embolism: Secondary | ICD-10-CM

## 2019-04-11 DIAGNOSIS — I10 Essential (primary) hypertension: Secondary | ICD-10-CM | POA: Diagnosis not present

## 2019-04-11 DIAGNOSIS — Z0000 Encounter for general adult medical examination without abnormal findings: Secondary | ICD-10-CM

## 2019-04-11 DIAGNOSIS — D6852 Prothrombin gene mutation: Secondary | ICD-10-CM

## 2019-04-11 NOTE — Patient Instructions (Addendum)
General Preventive Care  Most recent routine labs: ordered!  Blood pressure goal 130/80 or less.   Tobacco: don't! Please let me know if you need help quitting!  Alcohol: responsible moderation is ok for most adults - if you have concerns about your alcohol intake, please talk to me!   Exercise: as tolerated to reduce risk of cardiovascular disease and diabetes. Strength training will also prevent osteoporosis.   Mental health: if need for mental health care (medicines, counseling, other), or concerns about moods, please let me know!   Sexual health: if need for STD testing, or if concerns with libido/pain problems, please let me know! If you ever need to discuss your birth control options, please let me know!   Advanced Directive: Living Will and/or Healthcare Power of Attorney recommended for all adults, regardless of age or health.  Vaccines  Flu vaccine: recommended for almost everyone, every fall.   Shingles vaccine: recommended after age 63.   Pneumonia vaccines: recommended after age 25  Tetanus booster: Tdap recommended every 10 years. Due 01/2025 Cancer screenings   Colon cancer screening: recommended for everyone at age 87, but some folks need a colonoscopy sooner if risk factors   Breast cancer screening: mammogram recommended at age 56 every other year at least, and annually after age 63.   Cervical cancer screening: Pap every 1 to 5 years depending on previous results, age and other risk factors. My records indicate you'll be due 2021. Let's definitely do a pap at next annual if you haven't had this elsewhere!   Lung cancer screening: CT chest every year for those age 54 to 38 years old with ?30 pack year smoking history, who either currently smoke or have quit within the past 15 years. Infection screenings . HIV: recommended screening at least once age 41-65, more often as needed. . Gonorrhea/Chlamydia: screening as needed. . Hepatitis C: recommended for anyone  born 52-1965 . TB: certain at-risk populations, or depending on work requirements and/or travel history Other . Bone Density Test: recommended for women at age 98, sooner (after age 53) if smoker or other risk factors.

## 2019-04-11 NOTE — Progress Notes (Signed)
HPI: Lorraine Lynch is a 38 y.o. female who  has a past medical history of Anemia, Genetic testing, High blood pressure, History of pulmonary embolism (10/14/2017), Pneumonia, PONV (postoperative nausea and vomiting), Pre-diabetes, Prothrombin gene mutation [facility]), Sleep apnea, Spinal stenosis, and Thrombophlebitis of leg, right, superficial (02/2018).  she presents to [facility] today, 04/11/19,  for chief complaint of: Annual physical    Patient here for annual physical / wellness exam.  See preventive care reviewed as below.  Recent labs reviewed in detail with the patient.   Additional concerns today include:  None       Past medical, surgical, social and family history reviewed:  Patient Active Problem List   Diagnosis Date Noted  . Prediabetes 05/18/2018  . Pelvic mass 04/07/2018  . Sciatica of right side 02/17/2018  . Ovarian cyst 02/01/2018  . History of pulmonary embolism 10/14/2017  . Essential hypertension 10/14/2017  . Family history of MI (myocardial infarction) 10/14/2017    Past Surgical History:  Procedure Laterality Date  . APPENDECTOMY  2010   laparoscopic not ruptured  . BACK SURGERY  2019  . BREAST REDUCTION SURGERY  05/2000  . EXPLORATORY LAPAROTOMY     Dr. Denman George 04-07-18  . LAPAROTOMY N/A 04/07/2018   Procedure: EXPLORATORY LAPAROTOMY;  Surgeon: Everitt Amber, MD;  Location: [facility];  Service: Gynecology;  Laterality: N/A;  . OOPHORECTOMY Right 04/07/2018   Procedure: RIGHT SALPINGO-OOPHORECTOMY;  Surgeon: Everitt Amber, MD;  Location: [facility];  Service: Gynecology;  Laterality: Right;  . OTHER SURGICAL HISTORY  02/08/2018   Epidural steriod injection    Social History   Tobacco Use  . Smoking status: Current Every Day Smoker    Packs/day: 0.25    Types: Cigarettes  . Smokeless tobacco: Never Used  Substance Use Topics  . Alcohol use: Not Currently    Frequency: Never    Comment: less than 1 / week    Family  History  Problem Relation Age of Onset  . Alcoholism Mother   . Heart attack Mother   . Diabetes Mother   . High Cholesterol Mother   . High blood pressure Mother   . Non-Hodgkin's lymphoma Maternal Uncle 40     Current medication list and allergy/intolerance information reviewed:    Current Outpatient Medications  Medication Sig Dispense Refill  . apixaban (ELIQUIS) 2.5 MG TABS tablet Take 1 tablet (2.5 mg total) by mouth 2 (two) times daily. 60 tablet 6  . ibuprofen (ADVIL,MOTRIN) 200 MG tablet Take 400 mg by mouth 2 (two) times daily as needed for headache or moderate pain.    Marland Kitchen lisinopril (ZESTRIL) 10 MG tablet Take 1 tablet by mouth 2 (two) times daily.    . metFORMIN (GLUCOPHAGE-XR) 500 MG 24 hr tablet TAKE 1 TABLET BY MOUTH EVERY DAY WITH BREAKFAST 90 tablet 3   No current facility-administered medications for this visit.     Allergies  Allergen Reactions  . Latex Hives, Itching, Rash and Other (See Comments)    Burning   . Tuberculin Ppd Dermatitis and Rash      Review of Systems:  Constitutional:  No  fever, no chills, No recent illness, No unintentional weight changes. No significant fatigue.   HEENT: No  headache, no vision change, no hearing change, No sore throat, No  sinus pressure  Cardiac: No  chest pain, No  pressure, No palpitations, No  Orthopnea  Respiratory:  No  shortness of breath. No  Cough  Gastrointestinal: No  abdominal pain, No  nausea, No  vomiting,  No  blood in stool, No  diarrhea, No  constipation   Musculoskeletal: No new myalgia/arthralgia  Skin: No  Rash, No other wounds/concerning lesions  Hem/Onc: No  easy bruising/bleeding  Endocrine: No cold intolerance,  No heat intolerance.  Neurologic: No  weakness, No  dizziness  Psychiatric: No  concerns with depression, No  concerns with anxiety, No sleep problems, No mood problems  Exam:  BP 116/80 (BP Location: Left Arm, Patient Position: Sitting, Cuff Size: Normal)   Pulse 92    Temp 99.2 F (37.3 C) (Oral)   Wt 279 lb 3.2 oz (126.6 kg)   BMI 45.06 kg/m   Constitutional: VS see above. General Appearance: alert, well-developed, well-nourished, NAD  Eyes: Normal lids and conjunctive, non-icteric sclera  Ears, Nose, Mouth, Throat: TM normal bilaterally.   Neck: No masses, trachea midline. No thyroid enlargement. No tenderness/mass appreciated. No lymphadenopathy  Respiratory: Normal respiratory effort. no wheeze, no rhonchi, no rales  Cardiovascular: S1/S2 normal, no murmur, no rub/gallop auscultated. RRR. No lower extremity edema.   Gastrointestinal: Nontender, no masses. No hepatomegaly, no splenomegaly. No hernia appreciated. Bowel sounds normal. Rectal exam deferred.   Musculoskeletal: Gait normal. No clubbing/cyanosis of digits.   Neurological: Normal balance/coordination. No tremor. No cranial nerve deficit on limited exam. Motor and sensation intact and symmetric. Cerebellar reflexes intact.   Skin: warm, dry, intact. No rash/ulcer. No concerning nevi or subq nodules on limited exam.    Psychiatric: Normal judgment/insight. Normal mood and affect. Oriented x3.    No results found for this or any previous visit (from the past 72 hour(s)).  No results found.   ASSESSMENT/PLAN: The primary encounter diagnosis was Annual physical exam. Diagnoses of Need for influenza vaccination, Essential hypertension, Prediabetes, History of pulmonary embolism, and Prothrombin gene mutation [facility]) were also pertinent to this visit.   Orders Placed This Encounter  Procedures  . Flu Vaccine QUAD 6+ mos PF IM (Fluarix Quad PF)  . CBC  . COMPLETE METABOLIC PANEL WITH GFR  . Lipid panel  . TSH  . Hemoglobin A1c    No orders of the defined types were placed in this encounter.   Patient Instructions  General Preventive Care  Most recent routine labs: ordered!  Blood pressure goal 130/80 or less.   Tobacco: don't! Please let me know if you need help  quitting!  Alcohol: responsible moderation is ok for most adults - if you have concerns about your alcohol intake, please talk to me!   Exercise: as tolerated to reduce risk of cardiovascular disease and diabetes. Strength training will also prevent osteoporosis.   Mental health: if need for mental health care (medicines, counseling, other), or concerns about moods, please let me know!   Sexual health: if need for STD testing, or if concerns with libido/pain problems, please let me know! If you ever need to discuss your birth control options, please let me know!   Advanced Directive: Living Will and/or Healthcare Power of Attorney recommended for all adults, regardless of age or health.  Vaccines  Flu vaccine: recommended for almost everyone, every fall.   Shingles vaccine: recommended after age 47.   Pneumonia vaccines: recommended after age 22  Tetanus booster: Tdap recommended every 10 years. Due 01/2025 Cancer screenings   Colon cancer screening: recommended for everyone at age 9, but some folks need a colonoscopy sooner if risk factors   Breast cancer screening: mammogram recommended at age 78  every other year at least, and annually after age 63.   Cervical cancer screening: Pap every 1 to 5 years depending on previous results, age and other risk factors. My records indicate you'll be due 2021. Let's definitely do a pap at next annual if you haven't had this elsewhere!   Lung cancer screening: CT chest every year for those age 52 to 38 years old with ?30 pack year smoking history, who either currently smoke or have quit within the past 15 years. Infection screenings . HIV: recommended screening at least once age 3-65, more often as needed. . Gonorrhea/Chlamydia: screening as needed. . Hepatitis C: recommended for anyone born 79-1965 . TB: certain at-risk populations, or depending on work requirements and/or travel history Other . Bone Density Test: recommended for women at  age 54, sooner (after age 7) if smoker or other risk factors.         Visit summary with medication list and pertinent instructions was printed for patient to review. All questions at time of visit were answered - patient instructed to contact office with any additional concerns or updates. ER/RTC precautions were reviewed with the patient.    Please note: voice recognition software was used to produce this document, and typos may escape review. Please contact Dr. Sheppard Coil for any needed clarifications.     Follow-up plan: Return in about 6 months (around 10/09/2019) for Pap and recheck A1C .

## 2019-05-01 ENCOUNTER — Other Ambulatory Visit: Payer: Self-pay | Admitting: Osteopathic Medicine

## 2019-05-01 DIAGNOSIS — D6852 Prothrombin gene mutation: Secondary | ICD-10-CM

## 2019-05-01 DIAGNOSIS — Z86711 Personal history of pulmonary embolism: Secondary | ICD-10-CM

## 2019-05-01 DIAGNOSIS — I824Y1 Acute embolism and thrombosis of unspecified deep veins of right proximal lower extremity: Secondary | ICD-10-CM

## 2019-07-09 IMAGING — US US PELV - US TRANSVAGINAL
1 series · 13 of 25 positions shown · non-contrast
Comparison: 01/31/2018

CLINICAL DATA: Abnormal lumbar spine MRI demonstrating large pelvic
ovarian cyst

EXAM:
TRANSABDOMINAL AND TRANSVAGINAL ULTRASOUND OF PELVIS
DOPPLER ULTRASOUND OF OVARIES
TECHNIQUE: Both transabdominal and transvaginal ultrasound examinations of the
pelvis were performed. Transabdominal technique was performed for
global imaging of the pelvis including uterus, ovaries, adnexal
regions, and pelvic cul-de-sac.
It was necessary to proceed with endovaginal exam following the
transabdominal exam to visualize the uterus and adnexae in better
detail. Color and duplex Doppler ultrasound was utilized to evaluate
blood flow to the ovaries.

[Series 1: us pelv - us transvaginal · 0.25mm/px · 13 of 132 slices shown]
[im 1/132]
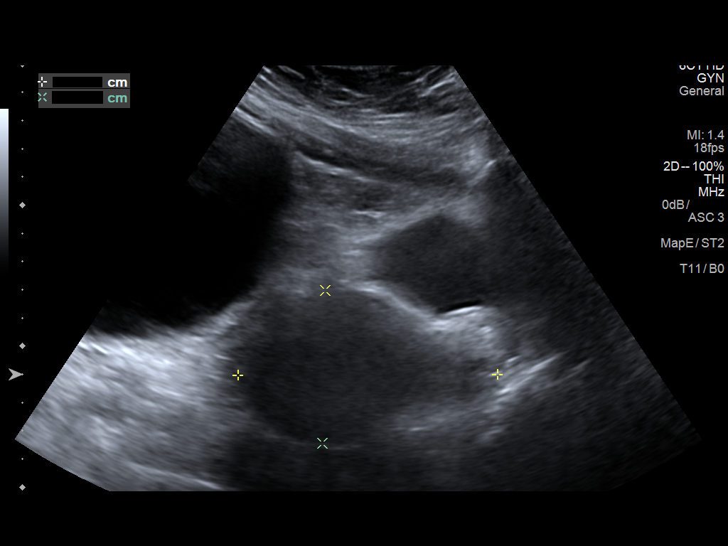
[im 11/132]
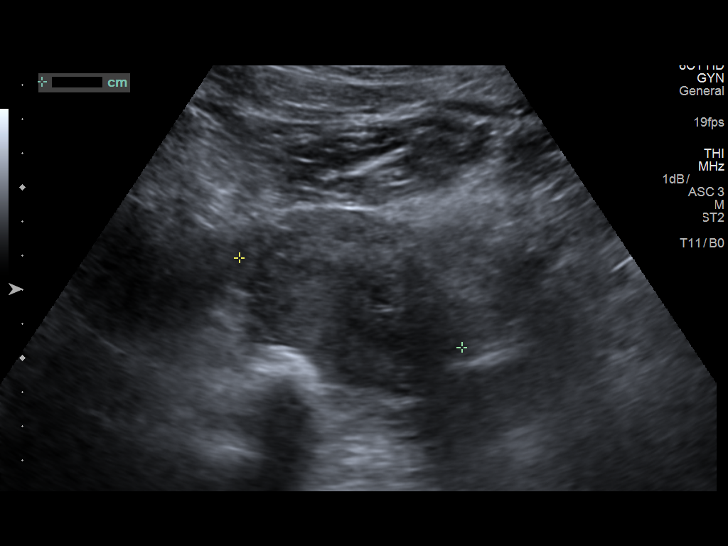
[im 22/132]
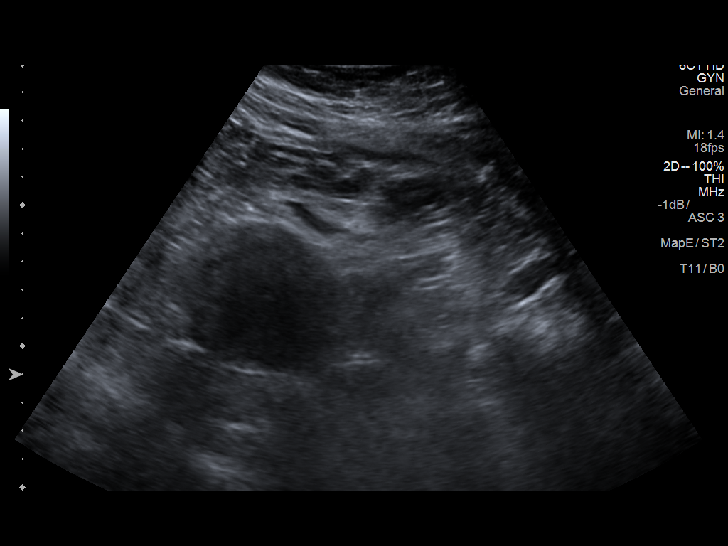
[im 33/132]
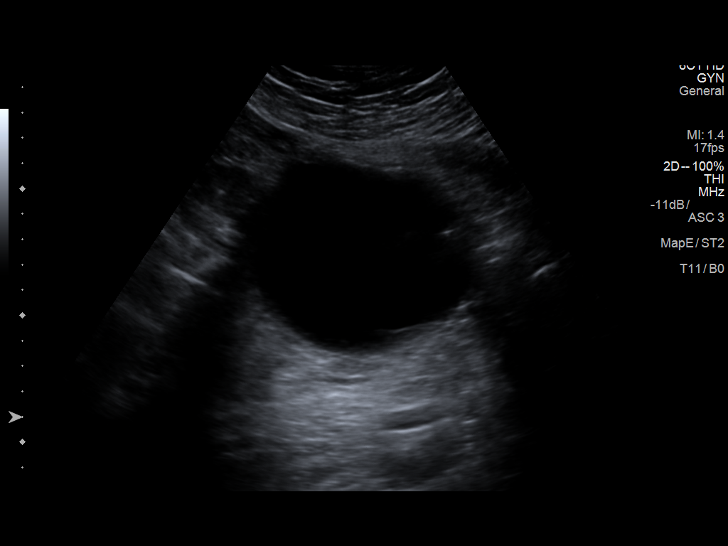
[im 44/132]
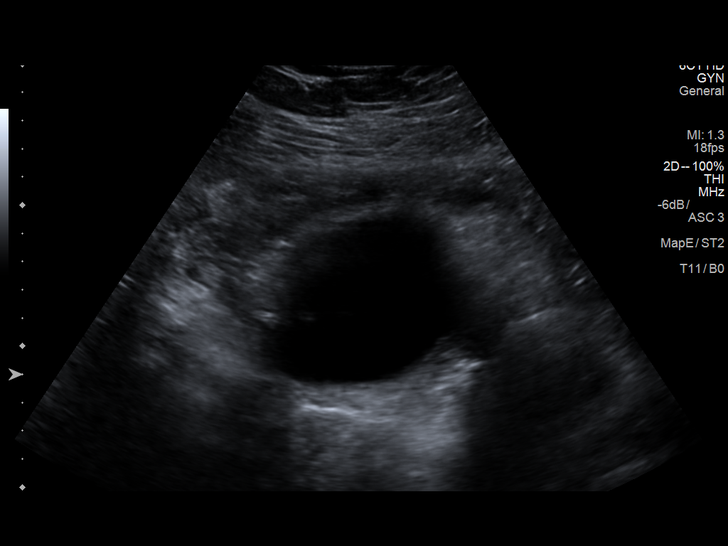
[im 55/132]
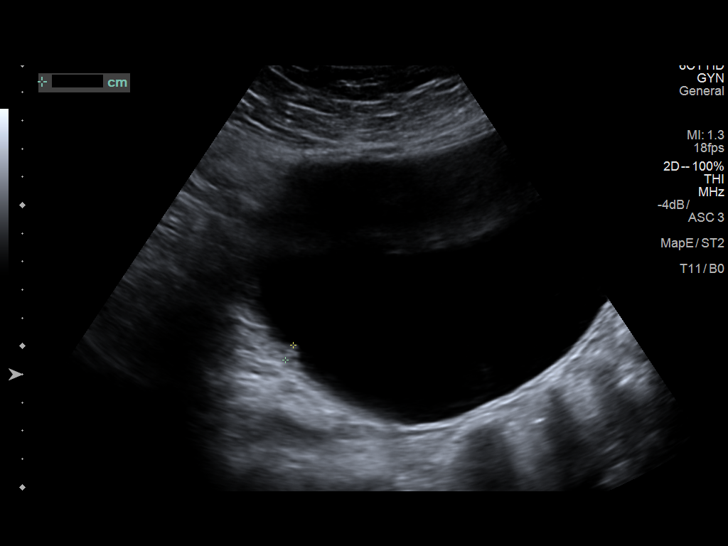
[im 66/132]
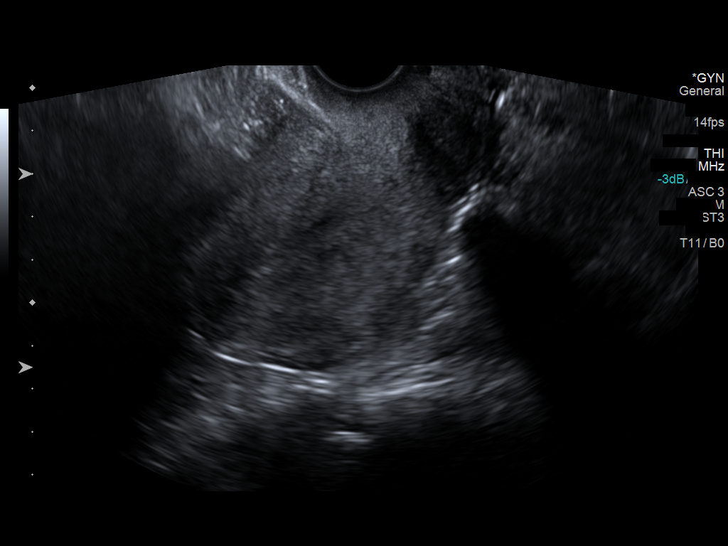
[im 77/132]
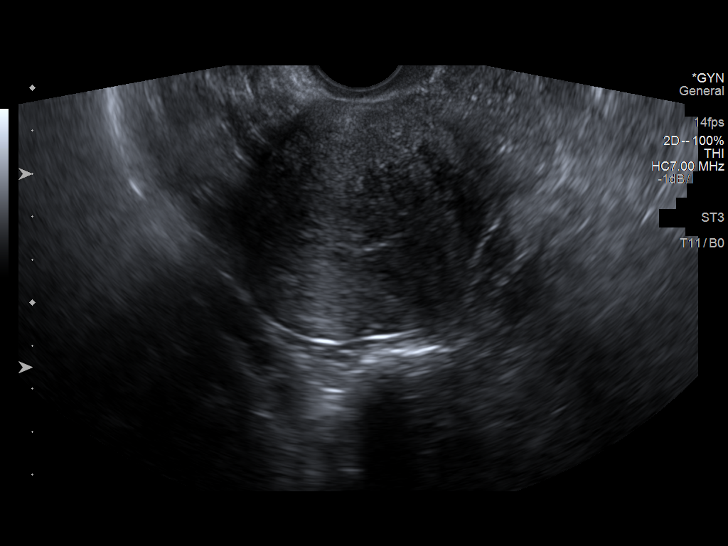
[im 88/132]
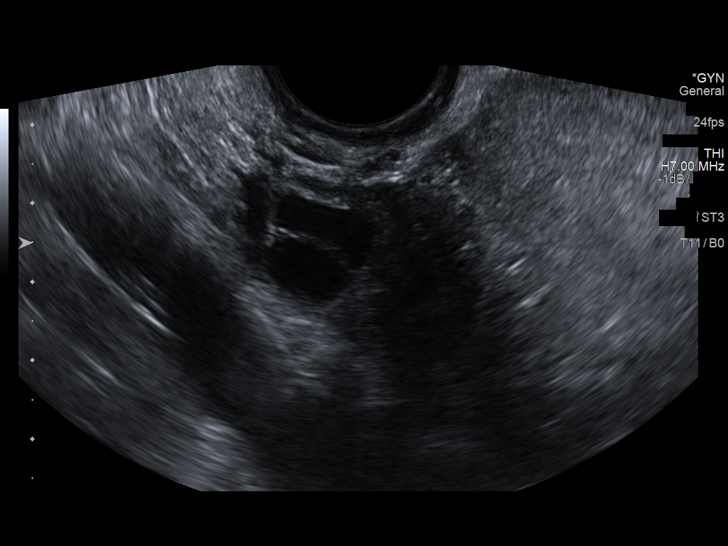
[im 99/132]
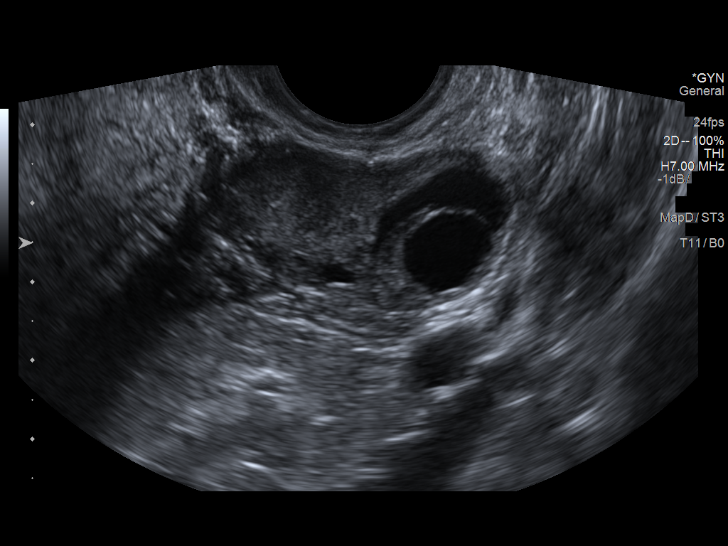
[im 110/132]
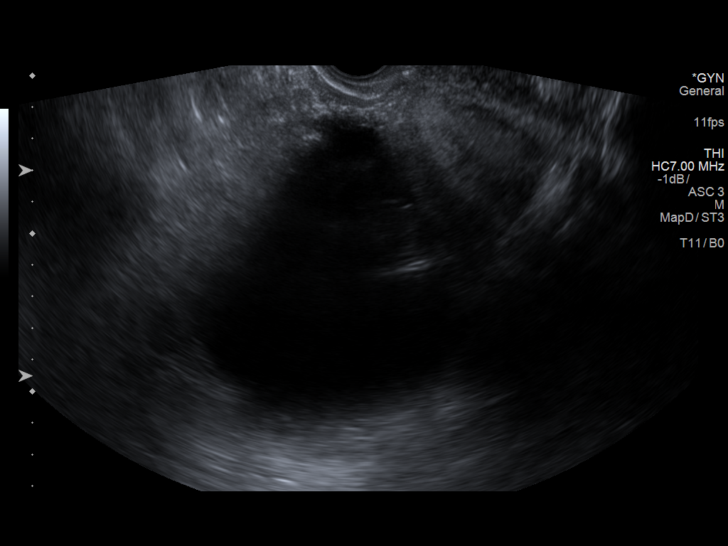
[im 121/132]
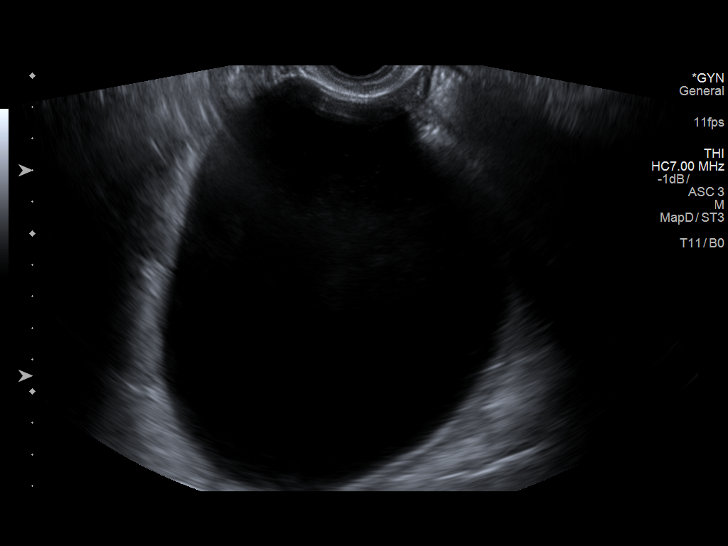
[im 132/132]
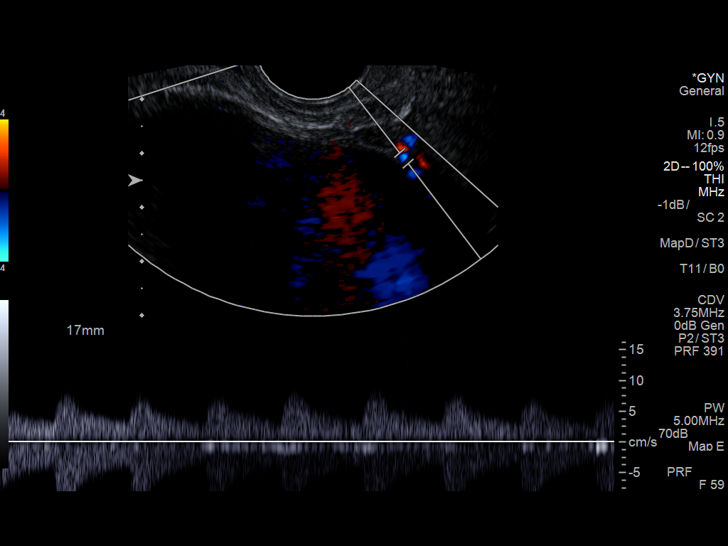

[13 of 25 positions shown; findings below may reference images not displayed]

FINDINGS: Uterus

Measurements: 8.5 x 5.4 x 5.7 cm. No fibroids or other mass
visualized.

Endometrium

Thickness: 8.9 mm.  No focal abnormality visualized.

Right ovary

Measurements: 13.6 x 9.9 x 18.5 cm. Right ovary contains a large
anechoic minimally complex cyst measuring 12.8 x 13.4 x 12.1 cm.
Slight peripheral wall thickening/nodularity. This correlates with
the lumbar spine MRI finding.

Left ovary

Measurements: 3.5 x 2.3 x 3.5 cm. Normal appearance/no adnexal mass.

Pulsed Doppler evaluation of both ovaries demonstrates normal
low-resistance arterial and venous waveforms.

Other findings

Trace pelvic free fluid
IMPRESSION: 12.8 cm right ovarian minimally complex large ovarian cyst
correlates with the lumbar spine MRI finding.

Since these may be difficult to assess completely with US, further
evaluation of cysts >7 cm with MRI or surgical evaluation is
recommended according to the Society of Radiologists in Ultrasound
3383 Consensus Conference Statement (Albino Calel et al. Management of
Asymptomatic Ovarian and other Adnexal Cysts Imaged at US: Society
of Radiologists in Ultrasound Consensus Conference Statement 3383.
Radiology [DATE]): [id number].).

No other acute finding by pelvic ultrasound

## 2019-08-24 DIAGNOSIS — Z03818 Encounter for observation for suspected exposure to other biological agents ruled out: Secondary | ICD-10-CM | POA: Diagnosis not present

## 2019-08-24 DIAGNOSIS — Z20828 Contact with and (suspected) exposure to other viral communicable diseases: Secondary | ICD-10-CM | POA: Diagnosis not present

## 2019-09-30 ENCOUNTER — Other Ambulatory Visit: Payer: Self-pay | Admitting: Osteopathic Medicine

## 2019-10-05 ENCOUNTER — Inpatient Hospital Stay: Payer: BC Managed Care – PPO | Attending: Family | Admitting: Family

## 2019-10-05 ENCOUNTER — Inpatient Hospital Stay: Payer: BC Managed Care – PPO

## 2019-10-10 ENCOUNTER — Ambulatory Visit (INDEPENDENT_AMBULATORY_CARE_PROVIDER_SITE_OTHER): Payer: BC Managed Care – PPO | Admitting: Osteopathic Medicine

## 2019-10-10 ENCOUNTER — Encounter: Payer: Self-pay | Admitting: Osteopathic Medicine

## 2019-10-10 ENCOUNTER — Other Ambulatory Visit: Payer: Self-pay

## 2019-10-10 VITALS — BP 127/89 | HR 102 | Temp 97.8°F | Wt 286.1 lb

## 2019-10-10 DIAGNOSIS — Z86718 Personal history of other venous thrombosis and embolism: Secondary | ICD-10-CM | POA: Diagnosis not present

## 2019-10-10 DIAGNOSIS — I10 Essential (primary) hypertension: Secondary | ICD-10-CM

## 2019-10-10 DIAGNOSIS — D6852 Prothrombin gene mutation: Secondary | ICD-10-CM

## 2019-10-10 DIAGNOSIS — R7303 Prediabetes: Secondary | ICD-10-CM | POA: Diagnosis not present

## 2019-10-10 DIAGNOSIS — Z0000 Encounter for general adult medical examination without abnormal findings: Secondary | ICD-10-CM | POA: Diagnosis not present

## 2019-10-10 DIAGNOSIS — Z86711 Personal history of pulmonary embolism: Secondary | ICD-10-CM

## 2019-10-10 LAB — POCT GLYCOSYLATED HEMOGLOBIN (HGB A1C): Hemoglobin A1C: 6.3 % — AB (ref 4.0–5.6)

## 2019-10-10 MED ORDER — LISINOPRIL 10 MG PO TABS: 10.0000 mg | ORAL_TABLET | Freq: Two times a day (BID) | ORAL | 3 refills | Status: DC

## 2019-10-10 MED ORDER — APIXABAN 2.5 MG PO TABS: 2.5000 mg | ORAL_TABLET | Freq: Two times a day (BID) | ORAL | 3 refills | Status: DC

## 2019-10-10 MED ORDER — METFORMIN HCL ER 500 MG PO TB24: 500.0000 mg | ORAL_TABLET | Freq: Every day | ORAL | 3 refills | Status: DC

## 2019-10-10 NOTE — Progress Notes (Signed)
Lorraine Lynch is a 39 y.o. female who presents to  [place] [facility]  today, 10/10/19, seeking care for the following: .      ASSESSMENT & PLAN with other pertinent history/findings:  The primary encounter diagnosis was Prediabetes. Diagnoses of Annual physical exam, History of DVT (deep vein thrombosis), History of pulmonary embolism, Prothrombin gene mutation ([place]), and Essential hypertension were also pertinent to this visit.  A1C stable from previous Today 6.3 Hasn't taken Metformin in about a month  >1 year since last cholesterol checked  CMP 6 mos ago ok except Glc 124   Patient Instructions  Labs ordered for future visit. Annual physical / preventive care was NOT performed or billed today!   Let's see you back in 6 mos for annual / labs!        Orders Placed This Encounter  Procedures  . CBC  . COMPLETE METABOLIC PANEL WITH GFR  . Lipid panel  . Hemoglobin A1c  . POCT HgB A1C    Meds ordered this encounter  Medications  . metFORMIN (GLUCOPHAGE-XR) 500 MG 24 hr tablet    Sig: Take 1 tablet (500 mg total) by mouth daily with breakfast.    Dispense:  90 tablet    Refill:  3  . lisinopril (ZESTRIL) 10 MG tablet    Sig: Take 1 tablet (10 mg total) by mouth in the morning and at bedtime.    Dispense:  180 tablet    Refill:  3  . apixaban (ELIQUIS) 2.5 MG TABS tablet    Sig: Take 1 tablet (2.5 mg total) by mouth 2 (two) times daily.    Dispense:  180 tablet    Refill:  3       Follow-up instructions: No follow-ups on file.                                         BP 127/89 (BP Location: Left Arm, Patient Position: Sitting, Cuff Size: Large)   Pulse (!) 102   Temp 97.8 F (36.6 C) (Oral)   Wt 286 lb 1.3 oz (129.8 kg)   BMI 46.17 kg/m   Current Meds  Medication Sig  . ELIQUIS 2.5 MG TABS tablet TAKE 1 TABLET BY MOUTH TWICE A DAY  . ibuprofen (ADVIL,MOTRIN)  200 MG tablet Take 400 mg by mouth 2 (two) times daily as needed for headache or moderate pain.  Marland Kitchen lisinopril (ZESTRIL) 10 MG tablet TAKE 2 TABS BY MOUTH EVERY DAY  . metFORMIN (GLUCOPHAGE-XR) 500 MG 24 hr tablet TAKE 1 TABLET BY MOUTH EVERY DAY WITH BREAKFAST    No results found for this or any previous visit (from the past 74 hour(s)).  No results found.  Depression screen [facility] 2/9 04/11/2019 09/19/2018 05/18/2018  Decreased Interest 0 0 0  Down, Depressed, Hopeless 0 0 0  PHQ - 2 Score 0 0 0  Altered sleeping 0 - 0  Tired, decreased energy 0 - 0  Change in appetite 0 - 0  Feeling bad or failure about yourself  0 - 0  Trouble concentrating 0 - 0  Moving slowly or fidgety/restless 0 - 0  Suicidal thoughts 0 - 0  PHQ-9 Score 0 - 0    GAD 7 : Generalized Anxiety Score 04/11/2019 09/19/2018 05/18/2018 10/14/2017  Nervous, Anxious, on Edge 0 0 0 0  Control/stop worrying  0 0 0 0  Worry too much - different things 0 0 0 0  Trouble relaxing 0 0 0 0  Restless 0 0 0 0  Easily annoyed or irritable 0 0 0 0  Afraid - awful might happen 0 0 0 0  Total GAD 7 Score 0 0 0 0      All questions at time of visit were answered - patient instructed to contact office with any additional concerns or updates.  ER/RTC precautions were reviewed with the patient.  Please note: voice recognition software was used to produce this document, and typos may escape review. Please contact Dr. Sheppard Coil for any needed clarifications.

## 2019-10-10 NOTE — Patient Instructions (Signed)
Labs ordered for future visit. Annual physical / preventive care was NOT performed or billed today!   Let's see you back in 6 mos for annual / labs!

## 2019-10-30 IMAGING — MR MR LUMBAR SPINE W/O CM
4 of 5 series · 21 of 48 positions shown · non-contrast
Comparison: CT of the abdomen and pelvis 01/30/2009.

CLINICAL DATA: Weakness of the lower extremity. Right-sided
sciatica. Cauda equina syndrome suspected.

EXAM:
MRI LUMBAR SPINE WITHOUT CONTRAST
TECHNIQUE: Multiplanar, multisequence MR imaging of the lumbar spine was
performed. No intravenous contrast was administered.

[Series 3: T2 · sagittal · 4.5mm · 0.59mm/px · 6 of 16 slices shown (1 of 2)]
[im 1/16]
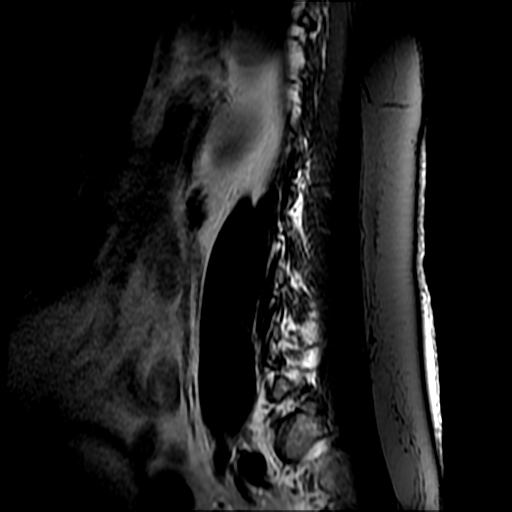
[im 4/16]
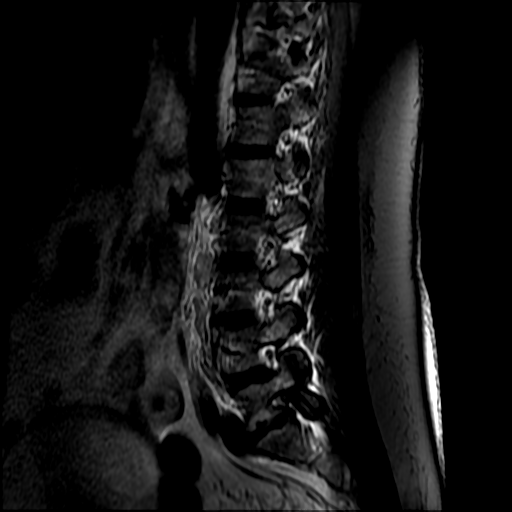
[im 7/16]
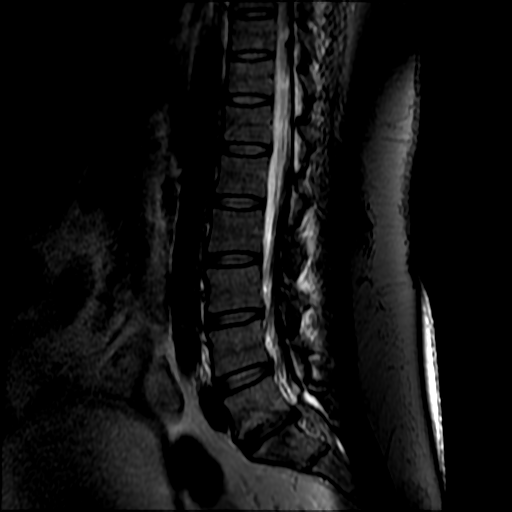
[im 10/16]
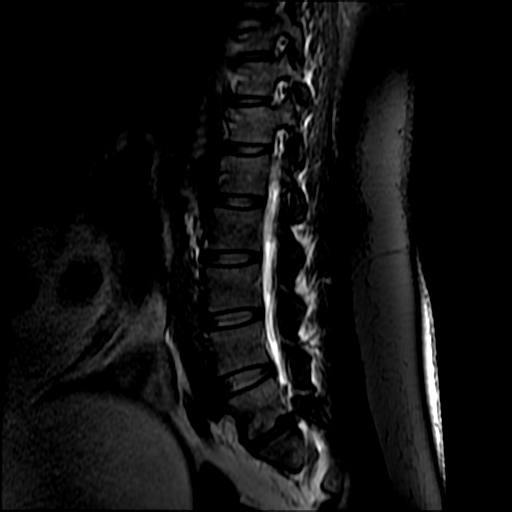
[im 13/16]
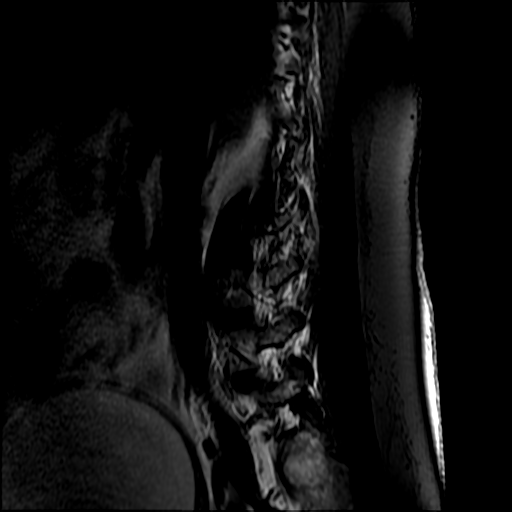
[im 16/16]
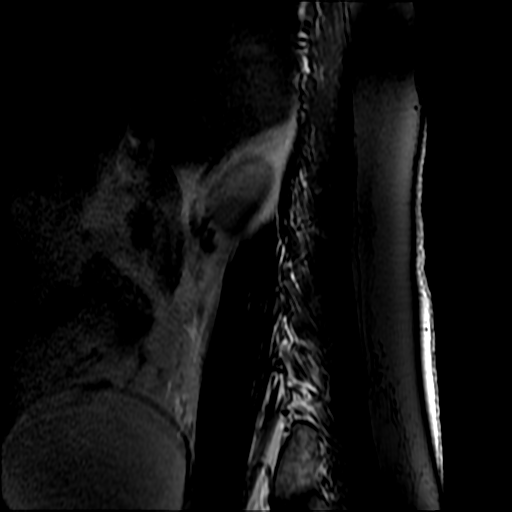

[Series 5: T1 · sagittal · 4.5mm · 0.59mm/px · 4 of 16 slices shown (1 of 2)]
[im 1/16]
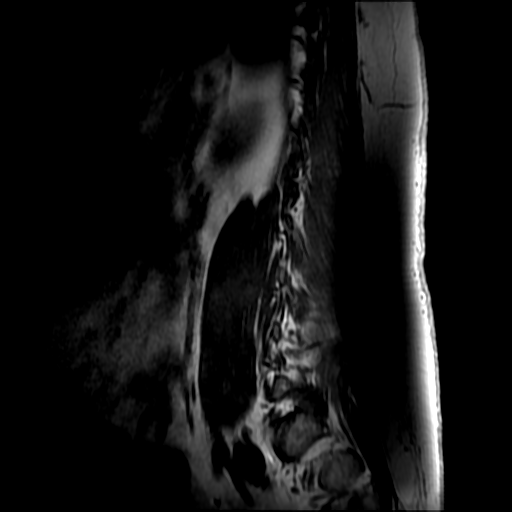
[im 3/16]
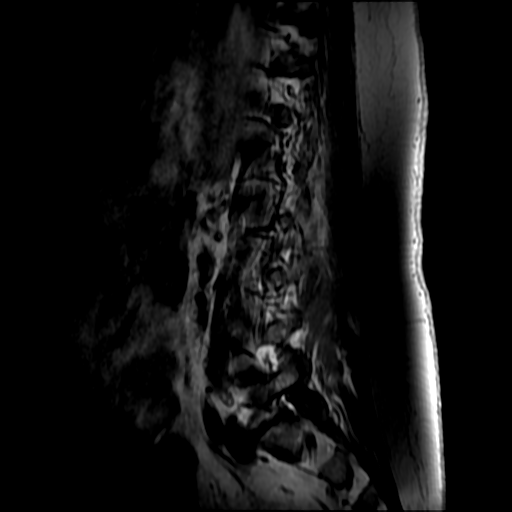
[im 8/16]
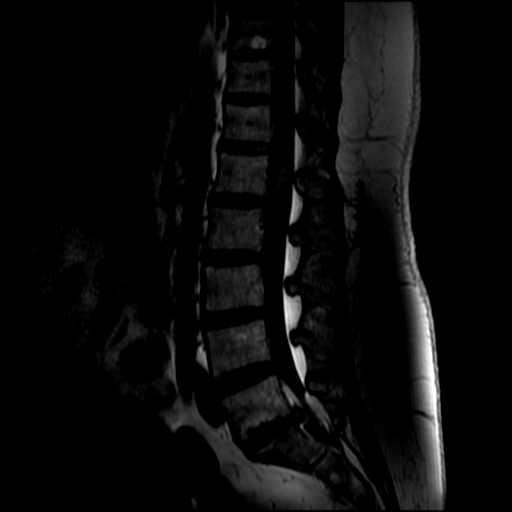
[im 13/16]
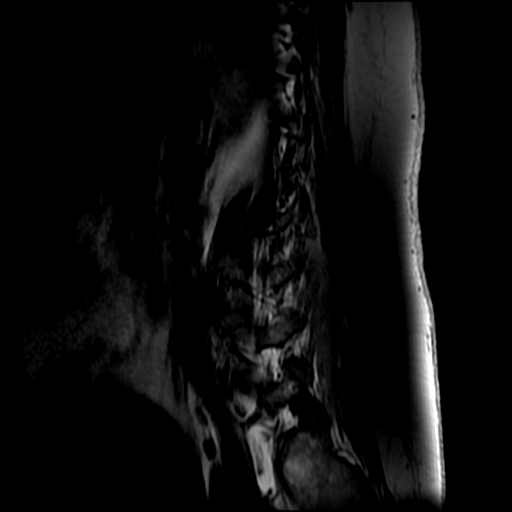

[Series 6: T2 · axial · 4.0mm · 0.43mm/px · z∈[-34,+157]mm · 8 of 34 slices shown (2 of 2)]
[im 1/34]
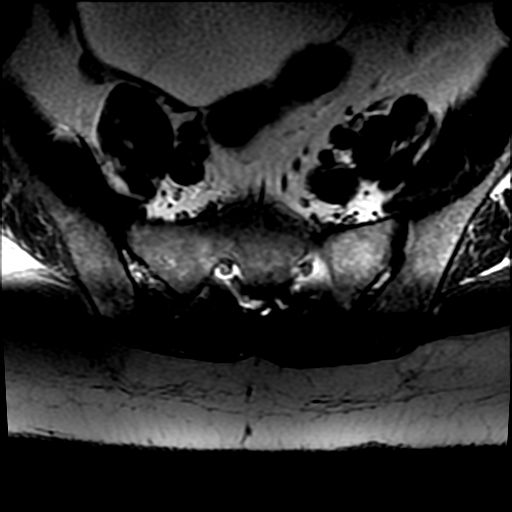
[im 6/34]
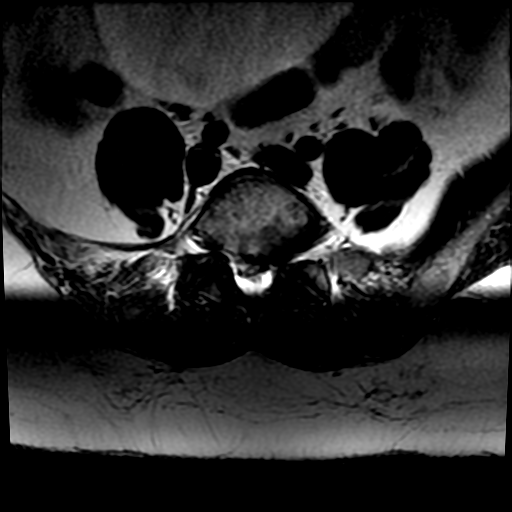
[im 11/34]
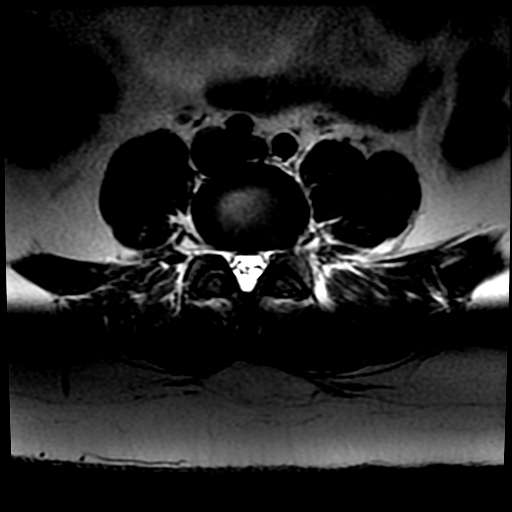
[im 16/34]
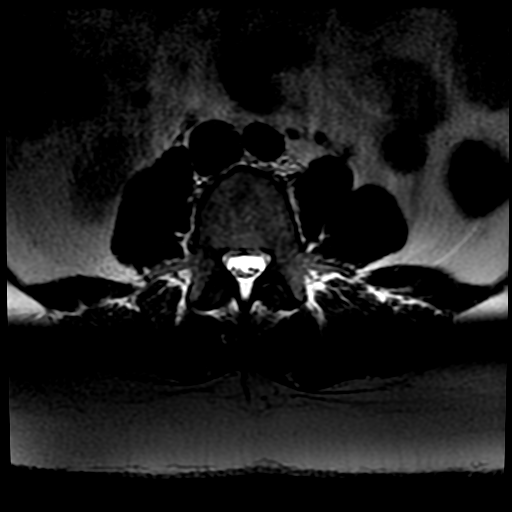
[im 18/34]
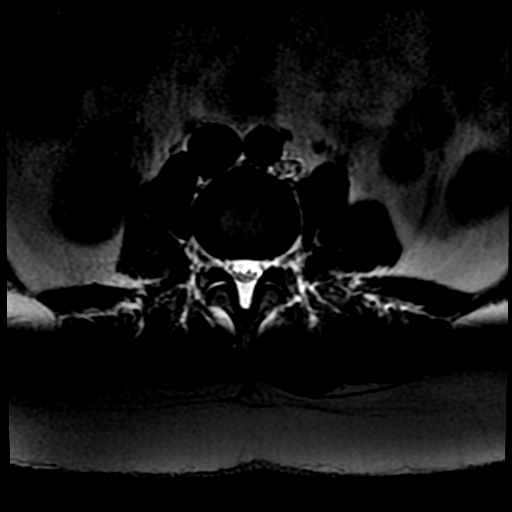
[im 23/34]
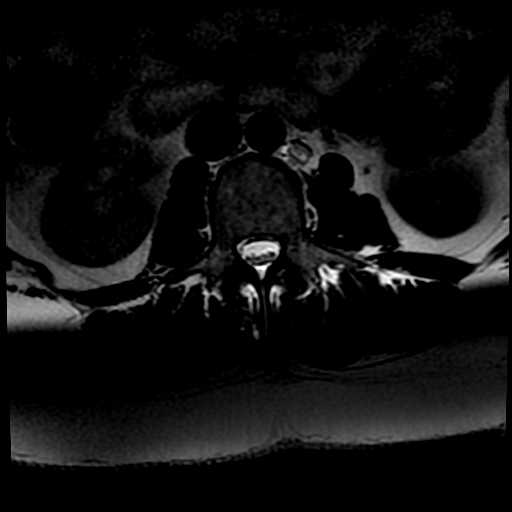
[im 28/34]
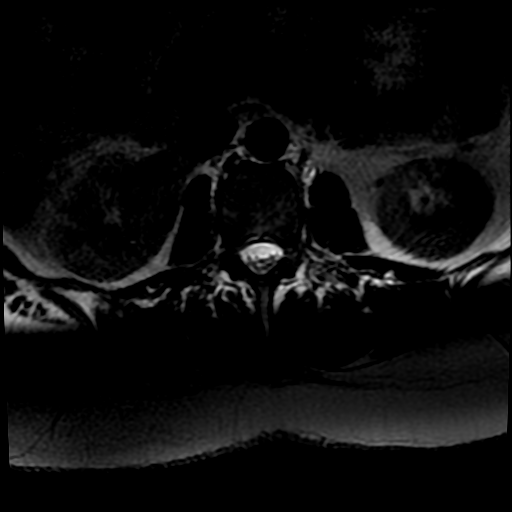
[im 34/34]
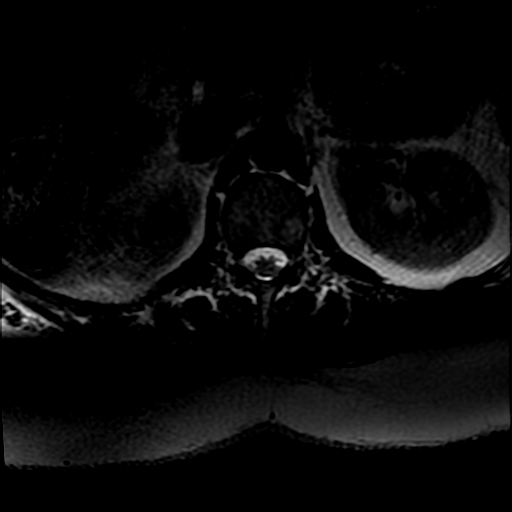

[Series 7: T1 · axial · 4.0mm · 0.86mm/px · z∈[-9,+128]mm · 3 of 34 slices shown (2 of 2)]
[im 6/34]
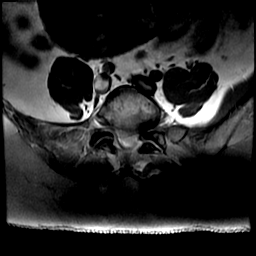
[im 18/34]
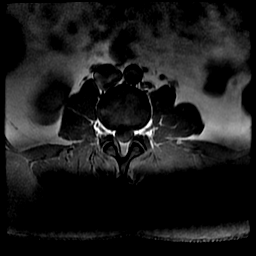
[im 28/34]
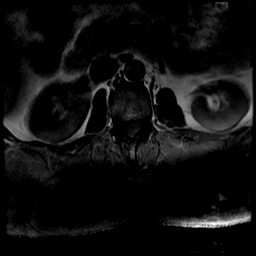

[21 of 48 positions shown; findings below may reference images not displayed]

FINDINGS: Segmentation: 5 non rib-bearing lumbar type vertebral bodies are
present. The lowest fully formed vertebral body is L5.

Alignment: AP alignment is anatomic. Rightward curvature is centered
at L3-4.

Vertebrae:  Chronic endplate marrow changes are present at L5-S1.

Conus medullaris and cauda equina: Conus extends to the L2 level.
Conus and cauda equina appear normal.

Paraspinal and other soft tissues: The urinary bladder extends
superiorly to the level of L4-5. Limited imaging of the abdomen is
otherwise unremarkable.. No significant adenopathy is present.

Disc levels:

This levels at L4-5 and above are normal.

L5-S1: A large right paramedian disc protrusion is present. Severe
right subarticular narrowing is present. Facet spurring contributes
to moderate foraminal stenosis bilaterally, right greater than left.
Prominent epidural fat is noted as well.
IMPRESSION: 1. Large right paramedian disc protrusion at L5-S1 with severe right
subarticular stenosis, likely impacting the right S1 nerve roots.
2. Moderate foraminal stenosis bilaterally at L5-S1 secondary to
disc disease and facet spurring, right greater than left.
3. No significant disease at L4-5 or above.
4. Moderate distention of the urinary bladder could be neurogenic.

## 2019-11-20 DIAGNOSIS — F4322 Adjustment disorder with anxiety: Secondary | ICD-10-CM | POA: Diagnosis not present

## 2019-11-28 DIAGNOSIS — F4322 Adjustment disorder with anxiety: Secondary | ICD-10-CM | POA: Diagnosis not present

## 2019-12-05 DIAGNOSIS — F4322 Adjustment disorder with anxiety: Secondary | ICD-10-CM | POA: Diagnosis not present

## 2019-12-12 DIAGNOSIS — F4322 Adjustment disorder with anxiety: Secondary | ICD-10-CM | POA: Diagnosis not present

## 2019-12-26 DIAGNOSIS — F4322 Adjustment disorder with anxiety: Secondary | ICD-10-CM | POA: Diagnosis not present

## 2020-01-02 DIAGNOSIS — F4322 Adjustment disorder with anxiety: Secondary | ICD-10-CM | POA: Diagnosis not present

## 2020-04-11 ENCOUNTER — Encounter: Payer: Self-pay | Admitting: Osteopathic Medicine

## 2020-04-11 ENCOUNTER — Ambulatory Visit (INDEPENDENT_AMBULATORY_CARE_PROVIDER_SITE_OTHER): Payer: Self-pay | Admitting: Osteopathic Medicine

## 2020-04-11 ENCOUNTER — Other Ambulatory Visit (HOSPITAL_COMMUNITY)
Admission: RE | Admit: 2020-04-11 | Discharge: 2020-04-11 | Disposition: A | Discharge status: Home / Self Care | Payer: Medicaid Other | Source: Ambulatory Visit | Attending: Osteopathic Medicine | Admitting: Osteopathic Medicine

## 2020-04-11 ENCOUNTER — Other Ambulatory Visit: Payer: Self-pay | Admitting: Osteopathic Medicine

## 2020-04-11 VITALS — BP 118/84 | HR 99 | Wt 283.0 lb

## 2020-04-11 DIAGNOSIS — Z124 Encounter for screening for malignant neoplasm of cervix: Secondary | ICD-10-CM

## 2020-04-11 DIAGNOSIS — Z86718 Personal history of other venous thrombosis and embolism: Secondary | ICD-10-CM | POA: Diagnosis not present

## 2020-04-11 DIAGNOSIS — R7303 Prediabetes: Secondary | ICD-10-CM

## 2020-04-11 DIAGNOSIS — M2550 Pain in unspecified joint: Secondary | ICD-10-CM

## 2020-04-11 DIAGNOSIS — D6852 Prothrombin gene mutation: Secondary | ICD-10-CM | POA: Diagnosis not present

## 2020-04-11 DIAGNOSIS — Z23 Encounter for immunization: Secondary | ICD-10-CM

## 2020-04-11 DIAGNOSIS — Z0000 Encounter for general adult medical examination without abnormal findings: Secondary | ICD-10-CM

## 2020-04-11 DIAGNOSIS — R1032 Left lower quadrant pain: Secondary | ICD-10-CM

## 2020-04-11 DIAGNOSIS — R21 Rash and other nonspecific skin eruption: Secondary | ICD-10-CM

## 2020-04-11 DIAGNOSIS — I10 Essential (primary) hypertension: Secondary | ICD-10-CM

## 2020-04-11 DIAGNOSIS — Z0001 Encounter for general adult medical examination with abnormal findings: Secondary | ICD-10-CM

## 2020-04-11 DIAGNOSIS — Z8349 Family history of other endocrine, nutritional and metabolic diseases: Secondary | ICD-10-CM

## 2020-04-11 DIAGNOSIS — Z86711 Personal history of pulmonary embolism: Secondary | ICD-10-CM

## 2020-04-11 HISTORY — DX: Family history of other endocrine, nutritional and metabolic diseases: Z83.49

## 2020-04-11 MED ORDER — APIXABAN 2.5 MG PO TABS: 2.5000 mg | ORAL_TABLET | Freq: Two times a day (BID) | ORAL | 3 refills | Status: DC

## 2020-04-11 MED ORDER — LISINOPRIL 10 MG PO TABS: 10.0000 mg | ORAL_TABLET | Freq: Every day | ORAL | 3 refills | Status: DC

## 2020-04-11 MED ORDER — METFORMIN HCL ER 500 MG PO TB24: 500.0000 mg | ORAL_TABLET | Freq: Every day | ORAL | 3 refills | Status: DC

## 2020-04-11 MED ORDER — TRIAMCINOLONE ACETONIDE 0.5 % EX CREA: 1.0000 "application " | TOPICAL_CREAM | Freq: Two times a day (BID) | CUTANEOUS | 2 refills | Status: DC

## 2020-04-11 NOTE — Progress Notes (Signed)
HPI: Lorraine Lynch is a 39 y.o. female who  has a past medical history of Anemia, Genetic testing, High blood pressure, History of pulmonary embolism (10/14/2017), Pneumonia, PONV (postoperative nausea and vomiting), Pre-diabetes, Prothrombin gene mutation [facility]), Sleep apnea, Spinal stenosis, and Thrombophlebitis of leg, right, superficial (02/2018).  she presents to [facility] today, 04/11/20,  for chief complaint of: Annual exam, abdominal pain, skin rash, joint pain  Patient reports she had a right oophorectomy 2 years ago, due to a large ovarian cyst on the right ovary. She states since that time, she has generally done well, but she has missed 3 periods and has occasional night sweats. She denies hot flashes. For the last 2 months, she has noticed more severe LLQ pain during her periods and new vomiting with the pain, so she is concerned she may be having problems with the left ovary. She also notes she has an abdominal muscle rectus separation from the last time she was pregnant. She states she tried doing exercises for this in the past, but she injured her back doing them, so she doesn't do them anymore.   She also reports years of dry skin patches over her hands, with red patches that occasionally appear on her face and back. She has tried gold bond eczema with some relief. She also notes some recent joint pain, particularly in the right middle finger. She states she has significant morning stiffness, which resolves throughout the day.     Past medical, surgical, social and family history reviewed:  Patient Active Problem List   Diagnosis Date Noted  . Family history of early menopause 04/11/2020  . Left lower quadrant abdominal pain 04/11/2020  . Prothrombin gene mutation ([place]) 04/11/2020  . History of DVT (deep vein thrombosis) 04/11/2020  . Prediabetes 05/18/2018  . Pelvic mass 04/07/2018  . Sciatica of right side 02/17/2018  . Ovarian cyst  02/01/2018  . History of pulmonary embolism 10/14/2017  . Essential hypertension 10/14/2017  . Family history of MI (myocardial infarction) 10/14/2017    Past Surgical History:  Procedure Laterality Date  . APPENDECTOMY  2010   laparoscopic not ruptured  . BACK SURGERY  2019  . BREAST REDUCTION SURGERY  05/2000  . EXPLORATORY LAPAROTOMY     Dr. Denman George 04-07-18  . LAPAROTOMY N/A 04/07/2018   Procedure: EXPLORATORY LAPAROTOMY;  Surgeon: Everitt Amber, MD;  Location: [facility];  Service: Gynecology;  Laterality: N/A;  . OOPHORECTOMY Right 04/07/2018   Procedure: RIGHT SALPINGO-OOPHORECTOMY;  Surgeon: Everitt Amber, MD;  Location: [facility];  Service: Gynecology;  Laterality: Right;  . OTHER SURGICAL HISTORY  02/08/2018   Epidural steriod injection    Social History   Tobacco Use  . Smoking status: Current Every Day Smoker    Packs/day: 0.25    Types: Cigarettes  . Smokeless tobacco: Never Used  Substance Use Topics  . Alcohol use: Not Currently    Comment: less than 1 / week    Family History  Problem Relation Age of Onset  . Alcoholism Mother   . Heart attack Mother   . Diabetes Mother   . High Cholesterol Mother   . High blood pressure Mother   . Non-Hodgkin's lymphoma Maternal Uncle 40     Current medication list and allergy/intolerance information reviewed:    Current Outpatient Medications  Medication Sig Dispense Refill  . apixaban (ELIQUIS) 2.5 MG TABS tablet Take 1 tablet (2.5 mg total) by mouth 2 (two) times daily. 180 tablet  3  . ibuprofen (ADVIL,MOTRIN) 200 MG tablet Take 400 mg by mouth 2 (two) times daily as needed for headache or moderate pain.    Marland Kitchen lisinopril (ZESTRIL) 10 MG tablet Take 1 tablet (10 mg total) by mouth daily. 90 tablet 3  . metFORMIN (GLUCOPHAGE-XR) 500 MG 24 hr tablet Take 1 tablet (500 mg total) by mouth daily with breakfast. 90 tablet 3  . triamcinolone cream (KENALOG) 0.5 % Apply 1 application topically 2 (two) times daily. To affected areas. 454  g 2   No current facility-administered medications for this visit.    Allergies  Allergen Reactions  . Latex Hives, Itching, Rash and Other (See Comments)    Burning  Other reaction(s): Other (See Comments), Other (See Comments), Unknown Burning Burning  . Tuberculin   . Tuberculin Ppd Dermatitis and Rash      Review of Systems:  Constitutional:  No  fever, no chills   HEENT: No  headache  Cardiac: No  chest pain  Respiratory:  No  shortness of breath. No  Cough  Gastrointestinal: +  abdominal pain, No  nausea, +  vomiting  Musculoskeletal: + right hand joint pain  Skin: + hand  Rash, No other wounds/concerning lesions  Neurologic: No  weakness, No  dizziness  Psychiatric: No  concerns with depression  Exam:  BP 118/84 (BP Location: Left Arm, Patient Position: Sitting)   Pulse 99   Wt 283 lb (128.4 kg)   SpO2 98%   BMI 45.68 kg/m   Constitutional: VS see above. General Appearance: alert, well-developed, well-nourished, NAD  Eyes: Normal lids and conjunctive, non-icteric sclera  Neck: No masses, trachea midline.  Respiratory: Normal respiratory effort. no wheeze, no rhonchi, no rales  Cardiovascular: S1/S2 normal, no murmur, no rub/gallop auscultated. RRR.   Gastrointestinal: Nontender, no masses.  Pelvic: No adnexal tenderness or CMT on bimanual exam. Normal vaginal discharge present in vault. Cervix visualized with no masses. Light bleeding noted from cervix with pap smear.  Musculoskeletal: Gait normal. No clubbing/cyanosis of digits.   Neurological: Normal balance/coordination. No tremor. No cranial nerve deficit on limited exam.  Skin: warm, dry, intact. Patches of dry skin overlying palmar surface of fingers on bilateral hands, with some skin cracks and small amount of dried blood. No concerning nevi or subq nodules on limited exam.    Psychiatric: Normal judgment/insight. Normal mood and affect. Oriented x3.    No results found for this or  any previous visit (from the past 72 hour(s)).  No results found.   ASSESSMENT/PLAN: The primary encounter diagnosis was Annual physical exam. Diagnoses of History of DVT (deep vein thrombosis), History of pulmonary embolism, Prothrombin gene mutation ([place]), Prediabetes, Essential hypertension, Family history of early menopause, Left lower quadrant abdominal pain, Polyarthralgia, Rash and nonspecific skin eruption, and Cervical cancer screening were also pertinent to this visit.   Abdominal pain  New pain a/w periods, with history of right oophorectomy d/t cyst, concerning for pathology of the left ovary. Additionally, pt has early symptoms of menopause beginning, with a family history of early menopause.  Ordered US to evaluate. Pending results, may refer to OB-Gyn for evaluation of this and risk for early menopause.  Joint pain, skin rash  Skin rash appears consistent with eczema. Prescribed triamcinolone cream. Can try something stronger, if this does not resolve.  Joint pain primarily limited to R middle finger. May be trigger finger, but with description of pain and skin symptoms, concern for autoimmune condition, such as rheumatoid arthritis, though  not meeting criteria without labs.  Ordered CRP, Rheumatoid factor, sed rate, ANA, and CCP antibodies. If unremarkable, will refer to Dr. Darene Lamer (sports med) for trigger finger evaluation vs other cause joint pain.   Routine Health Maintenance  Pt is due for routine labs, including CBC, CMP, lipid panel, and HbA1c.  Pap smear done today. Will follow up on results when available.  Follow up in 1 year for annual.  Orders Placed This Encounter  Procedures  . US Pelvic Complete With Transvaginal  . CBC  . COMPLETE METABOLIC PANEL WITH GFR  . Lipid panel  . Hemoglobin A1c  . High sensitivity CRP  . Rheumatoid factor  . Sedimentation rate  . ANA  . Cyclic citrul peptide antibody, IgG    Meds ordered this encounter  Medications  .  apixaban (ELIQUIS) 2.5 MG TABS tablet    Sig: Take 1 tablet (2.5 mg total) by mouth 2 (two) times daily.    Dispense:  180 tablet    Refill:  3  . lisinopril (ZESTRIL) 10 MG tablet    Sig: Take 1 tablet (10 mg total) by mouth daily.    Dispense:  90 tablet    Refill:  3  . metFORMIN (GLUCOPHAGE-XR) 500 MG 24 hr tablet    Sig: Take 1 tablet (500 mg total) by mouth daily with breakfast.    Dispense:  90 tablet    Refill:  3  . triamcinolone cream (KENALOG) 0.5 %    Sig: Apply 1 application topically 2 (two) times daily. To affected areas.    Dispense:  454 g    Refill:  2    Patient Instructions  Skin/joint concerns: will get labs to assess for autoimmune issues. Will send cream for rash - this sounds like eczema variant. If the cream is not helping, especially on the hands, let me know!   Gynecologic concerns: Pap today, will get set up for ultrasound, may need to follow w/ OBGYN regarding this and muscle issue     General Preventive Care  Most recent routine screening labs: ordered today.   Blood pressure goal 130/80 or less.   Tobacco: don't! Please let me know if you need help quitting!  Alcohol: responsible moderation is ok for most adults - if you have concerns about your alcohol intake, please talk to me!   Exercise: as tolerated to reduce risk of cardiovascular disease and diabetes. Strength training will also prevent osteoporosis.   Mental health: if need for mental health care (medicines, counseling, other), or concerns about moods, please let me know!   Sexual / Reproductive health: if need for STD testing, or if concerns with libido/pain problems, please let me know! If you need to discuss family planning, please let me know!   Advanced Directive: Living Will and/or Healthcare Power of Attorney recommended for all adults, regardless of age or health.  Vaccines  Flu vaccine: for almost everyone, every fall.   Shingles vaccine: after age 28.   Pneumonia  vaccines: after age 16.  Tetanus booster: every 10 years, Done 01/2015  COVID vaccine: THANKS for getting your vaccine! :)  Cancer screenings   Colon cancer screening: for everyone age 66-75.   Breast cancer screening: mammogram at age 42  Cervical cancer screening: Pap due today, If normal, repeat in 5 years.   Lung cancer screening: CT chest every year for those aged 59 to 88 years who have a 20 pack-year smoking history and currently smoke or have quit within the  past 15 years  Infection screenings  . HIV: recommended screening at least once age 27-65, more often as needed. . Gonorrhea/Chlamydia: screening as needed . Hepatitis C: recommended once for everyone age 51-75 . TB: certain at-risk populations, or depending on work requirements and/or travel history Other . Bone Density Test: recommended for women at age 52, sooner (age 23) if smoker        Please note: Preventive care issues were addressed today as part of your annual wellness physical, and this care should be covered under your insurance. However there were other medical issues which were also addressed today, and insurance may bill you separately for a "problem-based visit" for this care: pelvic pain, muscle concerns and joint/skin concerns. Any questions or concerns about charges which may appear on your billing statement should be directed to your insurance company or to [facility] billing department, and they will contact our office if there are further concerns.          Visit summary with medication list and pertinent instructions was printed for patient to review. All questions at time of visit were answered - patient instructed to contact office with any additional concerns or updates. ER/RTC precautions were reviewed with the patient.   Please note: voice recognition software was used to produce this document, and typos may escape review. Please contact Dr. Sheppard Coil for any needed clarifications.      Follow-up plan: Return in about 1 year (around 04/11/2021) for ANNUAL (call week prior to visit for lab orders), SOONER PENDING RESULTS / IF WORSE OR CHANGE.  Gweneth Dimitri, [facility] MS3

## 2020-04-11 NOTE — Progress Notes (Signed)
?  Inflammatory arthritis involving three or more joints. (See "Clinical manifestations of rheumatoid arthritis", section on 'Symptoms and physical findings'.) ?Positive rheumatoid factor (RF) and/or anti-citrullinated peptide/protein antibody (ACPA), such as anti-cyclic citrullinated peptide antibody (anti-CCP) testing. (See "Biologic markers in the diagnosis and assessment of rheumatoid arthritis", section on 'Anti-citrullinated peptide antibodies'.) ?Elevated levels of C-reactive protein (CRP) or the erythrocyte sedimentation rate (ESR). (See "Biologic markers in the diagnosis and assessment of rheumatoid arthritis", section on 'Erythrocyte sedimentation rate'.) ?Diseases with similar clinical features have been excluded, particularly psoriatic arthritis, acute viral polyarthritis, polyarticular gout or calcium pyrophosphate deposition disease, and systemic lupus erythematosus (SLE). (See 'Differential diagnosis' below.) ?The duration of symptoms is more than six weeks

## 2020-04-11 NOTE — Patient Instructions (Addendum)
Skin/joint concerns: will get labs to assess for autoimmune issues. Will send cream for rash - this sounds like eczema variant. If the cream is not helping, especially on the hands, let me know!   Gynecologic concerns: Pap today, will get set up for ultrasound, may need to follow w/ OBGYN regarding this and muscle issue     General Preventive Care  Most recent routine screening labs: ordered today.   Blood pressure goal 130/80 or less.   Tobacco: don't! Please let me know if you need help quitting!  Alcohol: responsible moderation is ok for most adults - if you have concerns about your alcohol intake, please talk to me!   Exercise: as tolerated to reduce risk of cardiovascular disease and diabetes. Strength training will also prevent osteoporosis.   Mental health: if need for mental health care (medicines, counseling, other), or concerns about moods, please let me know!   Sexual / Reproductive health: if need for STD testing, or if concerns with libido/pain problems, please let me know! If you need to discuss family planning, please let me know!   Advanced Directive: Living Will and/or Healthcare Power of Attorney recommended for all adults, regardless of age or health.  Vaccines  Flu vaccine: for almost everyone, every fall.   Shingles vaccine: after age 29.   Pneumonia vaccines: after age 51.  Tetanus booster: every 10 years, Done 01/2015  COVID vaccine: THANKS for getting your vaccine! :)  Cancer screenings   Colon cancer screening: for everyone age 50-75.   Breast cancer screening: mammogram at age 40  Cervical cancer screening: Pap due today, If normal, repeat in 5 years.   Lung cancer screening: CT chest every year for those aged 43 to 84 years who have a 20 pack-year smoking history and currently smoke or have quit within the past 15 years  Infection screenings  . HIV: recommended screening at least once age 8-65, more often as needed. . Gonorrhea/Chlamydia:  screening as needed . Hepatitis C: recommended once for everyone age 30-75 . TB: certain at-risk populations, or depending on work requirements and/or travel history Other . Bone Density Test: recommended for women at age 75, sooner (age 76) if smoker        Please note: Preventive care issues were addressed today as part of your annual wellness physical, and this care should be covered under your insurance. However there were other medical issues which were also addressed today, and insurance may bill you separately for a "problem-based visit" for this care: pelvic pain, muscle concerns and joint/skin concerns. Any questions or concerns about charges which may appear on your billing statement should be directed to your insurance company or to [facility] billing department, and they will contact our office if there are further concerns.

## 2020-04-12 LAB — COMPLETE METABOLIC PANEL WITH GFR
AG Ratio: 1.6 (calc) (ref 1.0–2.5)
ALT: 63 U/L — ABNORMAL HIGH (ref 6–29)
AST: 57 U/L — ABNORMAL HIGH (ref 10–30)
Albumin: 4.1 g/dL (ref 3.6–5.1)
Alkaline phosphatase (APISO): 68 U/L (ref 31–125)
BUN: 14 mg/dL (ref 7–25)
CO2: 26 mmol/L (ref 20–32)
Calcium: 9.7 mg/dL (ref 8.6–10.2)
Chloride: 104 mmol/L (ref 98–110)
Creat: 0.77 mg/dL (ref 0.50–1.10)
GFR, Est African American: 113 mL/min/{1.73_m2} (ref >=60)
GFR, Est Non African American: 97 mL/min/{1.73_m2} (ref >=60)
Globulin: 2.6 g/dL (calc) (ref 1.9–3.7)
Glucose, Bld: 92 mg/dL (ref 65–99)
Potassium: 4.5 mmol/L (ref 3.5–5.3)
Sodium: 140 mmol/L (ref 135–146)
Total Bilirubin: 0.5 mg/dL (ref 0.2–1.2)
Total Protein: 6.7 g/dL (ref 6.1–8.1)

## 2020-04-12 LAB — CBC
HCT: 36.3 % (ref 35.0–45.0)
Hemoglobin: 11.4 g/dL — ABNORMAL LOW (ref 11.7–15.5)
MCH: 23.1 pg — ABNORMAL LOW (ref 27.0–33.0)
MCHC: 31.4 g/dL — ABNORMAL LOW (ref 32.0–36.0)
MCV: 73.5 fL — ABNORMAL LOW (ref 80.0–100.0)
MPV: 10.9 fL (ref 7.5–12.5)
Platelets: 387 10*3/uL (ref 140–400)
RBC: 4.94 10*6/uL (ref 3.80–5.10)
RDW: 15.2 % — ABNORMAL HIGH (ref 11.0–15.0)
WBC: 12.1 10*3/uL — ABNORMAL HIGH (ref 3.8–10.8)

## 2020-04-12 LAB — CYTOLOGY - PAP
Comment: NEGATIVE
Diagnosis: NEGATIVE
High risk HPV: NEGATIVE

## 2020-04-12 LAB — HEMOGLOBIN A1C
Hgb A1c MFr Bld: 6.5 % of total Hgb — ABNORMAL HIGH (ref <5.7)
Mean Plasma Glucose: 140 (calc)
eAG (mmol/L): 7.7 (calc)

## 2020-04-12 LAB — LIPID PANEL
Cholesterol: 178 mg/dL (ref <200)
HDL: 41 mg/dL — ABNORMAL LOW (ref >=50)
LDL Cholesterol (Calc): 110 mg/dL (calc) — ABNORMAL HIGH
Non-HDL Cholesterol (Calc): 137 mg/dL (calc) — ABNORMAL HIGH (ref <130)
Total CHOL/HDL Ratio: 4.3 (calc) (ref <5.0)
Triglycerides: 158 mg/dL — ABNORMAL HIGH (ref <150)

## 2020-04-13 LAB — HIGH SENSITIVITY CRP: hs-CRP: >10 mg/L — ABNORMAL HIGH

## 2020-04-13 LAB — RHEUMATOID FACTOR: Rheumatoid fact SerPl-aCnc: <14 IU/mL (ref <14)

## 2020-04-13 LAB — ANTI-NUCLEAR AB-TITER (ANA TITER): ANA Titer 1: 1:40 {titer} — ABNORMAL HIGH

## 2020-04-13 LAB — ANA: Anti Nuclear Antibody (ANA): POSITIVE — AB

## 2020-04-13 LAB — SEDIMENTATION RATE

## 2020-04-13 LAB — CYCLIC CITRUL PEPTIDE ANTIBODY, IGG: Cyclic Citrullin Peptide Ab: <16 UNITS

## 2020-04-13 LAB — SED RATE MANUAL WEST RFLX: SED RATE BY MODIFIED WESTERGREN,MANUAL: 8 mm/h (ref 0–20)

## 2020-04-16 ENCOUNTER — Ambulatory Visit (INDEPENDENT_AMBULATORY_CARE_PROVIDER_SITE_OTHER): Payer: Self-pay

## 2020-04-16 ENCOUNTER — Other Ambulatory Visit: Payer: Self-pay

## 2020-04-16 DIAGNOSIS — R1032 Left lower quadrant pain: Secondary | ICD-10-CM

## 2020-05-15 ENCOUNTER — Encounter: Payer: Self-pay | Admitting: Osteopathic Medicine

## 2020-06-20 LAB — HM DIABETES EYE EXAM

## 2020-10-22 ENCOUNTER — Other Ambulatory Visit: Payer: Self-pay | Admitting: Osteopathic Medicine

## 2021-01-17 DIAGNOSIS — Z720 Tobacco use: Secondary | ICD-10-CM | POA: Diagnosis not present

## 2021-01-22 ENCOUNTER — Encounter: Payer: Self-pay | Admitting: Osteopathic Medicine

## 2021-01-23 ENCOUNTER — Other Ambulatory Visit: Payer: Self-pay

## 2021-01-23 ENCOUNTER — Encounter: Payer: Self-pay | Admitting: Osteopathic Medicine

## 2021-01-23 ENCOUNTER — Ambulatory Visit (INDEPENDENT_AMBULATORY_CARE_PROVIDER_SITE_OTHER): Payer: BC Managed Care – PPO | Admitting: Osteopathic Medicine

## 2021-01-23 VITALS — BP 132/79 | HR 60 | Temp 97.0°F | Wt 285.1 lb

## 2021-01-23 DIAGNOSIS — L247 Irritant contact dermatitis due to plants, except food: Secondary | ICD-10-CM

## 2021-01-23 MED ORDER — BETAMETHASONE DIPROPIONATE 0.05 % EX OINT: TOPICAL_OINTMENT | CUTANEOUS | 0 refills | Status: AC

## 2021-01-23 NOTE — Patient Instructions (Signed)
Poison Ivy Dermatitis Poison ivy dermatitis is inflammation of the skin that is caused by chemicals in the leaves of the poison ivy plant. The skin reaction often involvesredness, swelling, blisters, and extreme itching. What are the causes? This condition is caused by a chemical (urushiol) found in the sap of the poison ivy plant. This chemical is sticky and can be easily spread to people, animals, and objects. You can get poison ivy dermatitis by: Having direct contact with a poison ivy plant. Touching animals, other people, or objects that have come in contact with poison ivy and have the chemical on them. What increases the risk? This condition is more likely to develop in people who: Are outdoors often in wooded or [place] areas. Go outdoors without wearing protective clothing, such as closed shoes, long pants, and a long-sleeved shirt. What are the signs or symptoms? Symptoms of this condition include: Redness of the skin. Extreme itching. A rash that often includes bumps and blisters. The rash usually appears 48 hours after exposure, if you have been exposed before. If this is the first time you have been exposed, the rash may not appear until a week after exposure. Swelling. This may occur if the reaction is more severe. Symptoms usually last for 1-2 weeks. However, the first time you develop thiscondition, symptoms may last 3-4 weeks. How is this diagnosed? This condition may be diagnosed based on your symptoms and a physical exam.Your health care provider may also ask you about any recent outdoor activity. How is this treated? Treatment for this condition will vary depending on how severe it is. Treatment may include: Hydrocortisone cream or calamine lotion to relieve itching. Oatmeal baths to soothe the skin. Medicines, such as over-the-counter antihistamine tablets. Oral or topical steroid medicine, for more severe reactions. Follow these instructions at home: Medicines Take or  apply over-the-counter and prescription medicines only as told by your health care provider. Use hydrocortisone cream or calamine lotion as needed to soothe the skin and relieve itching. General instructions Do not scratch or rub your skin. Apply a cold, wet cloth (cold compress) to the affected areas or take baths in cool water. This will help with itching. Avoid hot baths and showers. Take oatmeal baths as needed. Use colloidal oatmeal. You can get this at your local pharmacy or grocery store. Follow the instructions on the packaging. While you have the rash, wash clothes right after you wear them. Keep all follow-up visits as told by your health care provider. This is important. How is this prevented?  Learn to identify the poison ivy plant and avoid contact with the plant. This plant can be recognized by the number of leaves. Generally, poison ivy has three leaves with flowering branches on a single stem. The leaves are typically glossy, and they have jagged edges that come to a point at the front. If you have been exposed to poison ivy, thoroughly wash with soap and water right away. You have about 30 minutes to remove the plant resin before it will cause the rash. Be sure to wash under your fingernails, because any plant resin there will continue to spread the rash. When hiking or camping, wear clothes that will help you to avoid exposure on the skin. This includes long pants, a long-sleeved shirt, tall socks, and hiking boots. You can also apply preventive lotion to your skin to help limit exposure. If you suspect that your clothes or outdoor gear came in contact with poison ivy, rinse them off outside with  a garden hose before you bring them inside your house. When doing yard work or gardening, wear gloves, long sleeves, long pants, and boots. Wash your garden tools and gloves if they come in contact with poison ivy. If you suspect that your pet has come into contact with poison ivy, wash him  or her with pet shampoo and water. Make sure to wear gloves while washing your pet. Contact a health care provider if you have: Open sores in the rash area. More redness, swelling, or pain in the affected area. Redness that spreads beyond the rash area. Fluid, blood, or pus coming from the affected area. A fever. A rash over a large area of your body. A rash on your eyes, mouth, or genitals. A rash that does not improve after a few weeks. Get help right away if: Your face swells or your eyes swell shut. You have trouble breathing. You have trouble swallowing. These symptoms may represent a serious problem that is an emergency. Do not wait to see if the symptoms will go away. Get medical help right away. Call your local emergency services (911 in the [place] Do not drive yourself to the hospital. Summary Poison ivy dermatitis is inflammation of the skin that is caused by chemicals in the leaves of the poison ivy plant. Symptoms of this condition include redness, itching, a rash, and swelling. Do not scratch or rub your skin. Take or apply over-the-counter and prescription medicines only as told by your health care provider. This information is not intended to replace advice given to you by your health care provider. Make sure you discuss any questions you have with your healthcare provider. Document Revised: 11/18/2018 Document Reviewed: 07/22/2018 Elsevier Patient Education  2022 [employer].

## 2021-01-23 NOTE — Progress Notes (Signed)
Lorraine Lynch is a 40 y.o. female who presents to  [facility]  today, 01/23/21, seeking care for the following:  Concern for rash on ankles. Present a few days. Extremely itchy. On exam, no erythema or warmth. There is mildly vesicular rash in patches on bilateral ankles c/w contact dermatitis such as poison ivy/oak.      ASSESSMENT & PLAN with other pertinent findings:  The encounter diagnosis was Irritant contact dermatitis due to plants, except food.    Patient Instructions  Poison Ivy Dermatitis Poison ivy dermatitis is inflammation of the skin that is caused by chemicals in the leaves of the poison ivy plant. The skin reaction often involvesredness, swelling, blisters, and extreme itching. What are the causes? This condition is caused by a chemical (urushiol) found in the sap of the poison ivy plant. This chemical is sticky and can be easily spread to people, animals, and objects. You can get poison ivy dermatitis by: Having direct contact with a poison ivy plant. Touching animals, other people, or objects that have come in contact with poison ivy and have the chemical on them. What increases the risk? This condition is more likely to develop in people who: Are outdoors often in wooded or [place] areas. Go outdoors without wearing protective clothing, such as closed shoes, long pants, and a long-sleeved shirt. What are the signs or symptoms? Symptoms of this condition include: Redness of the skin. Extreme itching. A rash that often includes bumps and blisters. The rash usually appears 48 hours after exposure, if you have been exposed before. If this is the first time you have been exposed, the rash may not appear until a week after exposure. Swelling. This may occur if the reaction is more severe. Symptoms usually last for 1-2 weeks. However, the first time you develop thiscondition, symptoms may last 3-4 weeks. How is  this diagnosed? This condition may be diagnosed based on your symptoms and a physical exam.Your health care provider may also ask you about any recent outdoor activity. How is this treated? Treatment for this condition will vary depending on how severe it is. Treatment may include: Hydrocortisone cream or calamine lotion to relieve itching. Oatmeal baths to soothe the skin. Medicines, such as over-the-counter antihistamine tablets. Oral or topical steroid medicine, for more severe reactions. Follow these instructions at home: Medicines Take or apply over-the-counter and prescription medicines only as told by your health care provider. Use hydrocortisone cream or calamine lotion as needed to soothe the skin and relieve itching. General instructions Do not scratch or rub your skin. Apply a cold, wet cloth (cold compress) to the affected areas or take baths in cool water. This will help with itching. Avoid hot baths and showers. Take oatmeal baths as needed. Use colloidal oatmeal. You can get this at your local pharmacy or grocery store. Follow the instructions on the packaging. While you have the rash, wash clothes right after you wear them. Keep all follow-up visits as told by your health care provider. This is important. How is this prevented?  Learn to identify the poison ivy plant and avoid contact with the plant. This plant can be recognized by the number of leaves. Generally, poison ivy has three leaves with flowering branches on a single stem. The leaves are typically glossy, and they have jagged edges that come to a point at the front. If you have been exposed to poison ivy, thoroughly wash with soap and water right  away. You have about 30 minutes to remove the plant resin before it will cause the rash. Be sure to wash under your fingernails, because any plant resin there will continue to spread the rash. When hiking or camping, wear clothes that will help you to avoid exposure on the  skin. This includes long pants, a long-sleeved shirt, tall socks, and hiking boots. You can also apply preventive lotion to your skin to help limit exposure. If you suspect that your clothes or outdoor gear came in contact with poison ivy, rinse them off outside with a garden hose before you bring them inside your house. When doing yard work or gardening, wear gloves, long sleeves, long pants, and boots. Wash your garden tools and gloves if they come in contact with poison ivy. If you suspect that your pet has come into contact with poison ivy, wash him or her with pet shampoo and water. Make sure to wear gloves while washing your pet. Contact a health care provider if you have: Open sores in the rash area. More redness, swelling, or pain in the affected area. Redness that spreads beyond the rash area. Fluid, blood, or pus coming from the affected area. A fever. A rash over a large area of your body. A rash on your eyes, mouth, or genitals. A rash that does not improve after a few weeks. Get help right away if: Your face swells or your eyes swell shut. You have trouble breathing. You have trouble swallowing. These symptoms may represent a serious problem that is an emergency. Do not wait to see if the symptoms will go away. Get medical help right away. Call your local emergency services (911 in the [place] Do not drive yourself to the hospital. Summary Poison ivy dermatitis is inflammation of the skin that is caused by chemicals in the leaves of the poison ivy plant. Symptoms of this condition include redness, itching, a rash, and swelling. Do not scratch or rub your skin. Take or apply over-the-counter and prescription medicines only as told by your health care provider. This information is not intended to replace advice given to you by your health care provider. Make sure you discuss any questions you have with your healthcare provider. Document Revised: 11/18/2018 Document Reviewed:  07/22/2018 Elsevier Patient Education  [place].  No orders of the defined types were placed in this encounter.   Meds ordered this encounter  Medications   betamethasone dipropionate (DIPROLENE) 0.05 % ointment    Sig: Apply 1 application topically 3 (three) times daily for 3 days, THEN 1 application 2 (two) times daily for 7 days. To affected area(s) as needed.    Dispense:  45 g    Refill:  0     See below for relevant physical exam findings  See below for recent lab and imaging results reviewed  Medications, allergies, PMH, PSH, SocH, FamH reviewed below    Follow-up instructions: No follow-ups on file.                                        Exam:  BP 132/79 (BP Location: Left Arm, Patient Position: Sitting, Cuff Size: Large)   Pulse 60   Temp (!) 97 F (36.1 C) (Oral)   Wt 285 lb 1.9 oz (129.3 kg)   BMI 46.02 kg/m  Constitutional: VS see above. General Appearance: alert, well-developed, well-nourished, NAD Neck: No masses, trachea  midline.  Respiratory: Normal respiratory effort.  CV: no LE edema  Musculoskeletal: Gait normal. Symmetric and independent movement of all extremities.  Neurological: Normal balance/coordination. No tremor. Skin: warm, dry, intact. See above  Psychiatric: Normal judgment/insight. Normal mood and affect. Oriented x3.   Current Meds  Medication Sig   apixaban (ELIQUIS) 2.5 MG TABS tablet Take 1 tablet (2.5 mg total) by mouth 2 (two) times daily.   betamethasone dipropionate (DIPROLENE) 0.05 % ointment Apply 1 application topically 3 (three) times daily for 3 days, THEN 1 application 2 (two) times daily for 7 days. To affected area(s) as needed.   ibuprofen (ADVIL,MOTRIN) 200 MG tablet Take 400 mg by mouth 2 (two) times daily as needed for headache or moderate pain.   lisinopril (ZESTRIL) 10 MG tablet TAKE 2 TABS BY MOUTH EVERY DAY   metFORMIN (GLUCOPHAGE-XR) 500 MG 24 hr tablet Take 1 tablet  (500 mg total) by mouth daily with breakfast.   [DISCONTINUED] triamcinolone cream (KENALOG) 0.5 % Apply 1 application topically 2 (two) times daily. To affected areas.    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[place]) 04/11/2020   History of DVT (deep vein thrombosis) 04/11/2020   Prediabetes 05/18/2018   Pelvic mass 04/07/2018   Sciatica of right side 02/17/2018   Ovarian cyst 02/01/2018   History of pulmonary embolism 10/14/2017   Essential hypertension 10/14/2017   Family history of MI (myocardial infarction) 10/14/2017    Family History  Problem Relation Age of Onset   Alcoholism Mother    Heart attack Mother    Diabetes Mother    High Cholesterol Mother    High blood pressure Mother    Non-Hodgkin's lymphoma Maternal Uncle 22    Social History   Tobacco Use  Smoking Status Every Day   Packs/day: 0.25   Pack years: 0.00   Types: Cigarettes  Smokeless Tobacco Never    Past Surgical History:  Procedure Laterality Date   APPENDECTOMY  2010   laparoscopic not ruptured   BACK SURGERY  2019   BREAST REDUCTION SURGERY  05/2000   EXPLORATORY LAPAROTOMY     Dr. Denman George 04-07-18   LAPAROTOMY N/A 04/07/2018   Procedure: EXPLORATORY LAPAROTOMY;  Surgeon: Everitt Amber, MD;  Location: [facility];  Service: Gynecology;  Laterality: N/A;   OOPHORECTOMY Right 04/07/2018   Procedure: RIGHT SALPINGO-OOPHORECTOMY;  Surgeon: Everitt Amber, MD;  Location: [facility];  Service: Gynecology;  Laterality: Right;   OTHER SURGICAL HISTORY  02/08/2018   Epidural steriod injection    Immunization History  Administered Date(s)  Administered   Influenza Split 05/10/2014   Influenza,inj,Quad PF,6+ Mos 05/18/2018, 04/11/2019, 04/11/2020   Influenza-Unspecified 06/29/2011   MMR 06/20/2014   Moderna Sars-Covid-2 Vaccination 08/15/2019, 09/12/2019   Tdap 01/09/2015    No results found for this or any previous visit (from the past 2160 hour(s)).  No results found.     All questions at time of visit were answered - patient instructed to contact office with any additional concerns or updates. ER/RTC precautions were reviewed with the patient as applicable.   Please note: manual typing as well  as voice recognition software may have been used to produce this document - typos may escape review. Please contact Dr. Sheppard Coil for any needed clarifications.

## 2021-04-25 ENCOUNTER — Other Ambulatory Visit: Payer: Self-pay | Admitting: Osteopathic Medicine

## 2021-04-25 DIAGNOSIS — Z86718 Personal history of other venous thrombosis and embolism: Secondary | ICD-10-CM

## 2021-04-25 DIAGNOSIS — D6852 Prothrombin gene mutation: Secondary | ICD-10-CM

## 2021-04-25 DIAGNOSIS — Z86711 Personal history of pulmonary embolism: Secondary | ICD-10-CM

## 2021-06-04 DIAGNOSIS — F4322 Adjustment disorder with anxiety: Secondary | ICD-10-CM | POA: Diagnosis not present

## 2021-06-10 ENCOUNTER — Ambulatory Visit (INDEPENDENT_AMBULATORY_CARE_PROVIDER_SITE_OTHER): Payer: BC Managed Care – PPO | Admitting: Medical-Surgical

## 2021-06-10 ENCOUNTER — Encounter: Payer: Self-pay | Admitting: Medical-Surgical

## 2021-06-10 ENCOUNTER — Other Ambulatory Visit: Payer: Self-pay

## 2021-06-10 VITALS — BP 111/76 | HR 93 | Resp 20 | Ht 66.0 in | Wt 281.0 lb

## 2021-06-10 DIAGNOSIS — R7303 Prediabetes: Secondary | ICD-10-CM | POA: Diagnosis not present

## 2021-06-10 DIAGNOSIS — I10 Essential (primary) hypertension: Secondary | ICD-10-CM | POA: Diagnosis not present

## 2021-06-10 DIAGNOSIS — Z7901 Long term (current) use of anticoagulants: Secondary | ICD-10-CM | POA: Diagnosis not present

## 2021-06-10 DIAGNOSIS — Z7689 Persons encountering health services in other specified circumstances: Secondary | ICD-10-CM

## 2021-06-10 DIAGNOSIS — R635 Abnormal weight gain: Secondary | ICD-10-CM | POA: Diagnosis not present

## 2021-06-10 DIAGNOSIS — N951 Menopausal and female climacteric states: Secondary | ICD-10-CM

## 2021-06-10 MED ORDER — SEMAGLUTIDE(0.25 OR 0.5MG/DOS) 2 MG/1.5ML ~~LOC~~ SOPN
0.2500 mg | PEN_INJECTOR | SUBCUTANEOUS | 0 refills | Status: DC
Start: 2021-06-10 — End: 2021-07-07

## 2021-06-10 NOTE — Progress Notes (Signed)
  HPI with pertinent ROS:   CC: transfer of care  HPI: Pleasant 40 year old female presenting today for transfer of care to a new PCP and for:  Hypertension-taking lisinopril 10 mg daily, tolerating well without side effects.Denies CP, SOB, palpitations, lower extremity edema, dizziness, headaches, or vision changes.  Prediabetes-previously taking metformin 500 mg XR but unfortunately she has significant GI symptoms with this including continuous diarrhea and severe abdominal cramping.  Is no longer interested in taking this medicine but is open to other options.  Has not used any other medications to help control diabetes.  Weight gain-notes that she has weight gain in specific areas including the buffalo hump as well as abdominal fat.  She has tried multiple things including walking and is able to lose weight in other places but these 2 areas do not seem to slim down.  History of DVT-taking Eliquis 2.5 mg twice daily as prescribed, tolerating well without side effects.  Perimenopausal-having approximately 4-6 periods per year that are heavy with clotting.  Also notes hot flashes and night sweats.  These are annoying symptoms however she does not feel like they are bad enough at this point to warrant starting a medication.  I reviewed the past medical history, family history, social history, surgical history, and allergies today and no changes were needed.  Please see the problem list section below in epic for further details.   Physical exam:   General: Well Developed, well nourished, and in no acute distress.  Neuro: Alert and oriented x3.  HEENT: Normocephalic, atraumatic.  Skin: Warm and dry. Cardiac: Regular rate and rhythm, no murmurs rubs or gallops, no lower extremity edema.  Respiratory: Clear to auscultation bilaterally. Not using accessory muscles, speaking in full sentences.  Impression and Recommendations:    1. Encounter to establish care Reviewed available information  and discussed care concerns with patient.    2. Essential hypertension Checking CBC with differential, CMP, and lipid panel today.  Continue lisinopril 10 mg daily.  Blood pressure well controlled. - Lipid panel - COMPLETE METABOLIC PANEL WITH GFR - CBC with Differential/Platelet  3. Prediabetes 4. Weight gain Checking hemoglobin A1c.  Discussed the importance of keeping A1c below 6.5 with a goal of reducing it to less than 5.7.  Discontinue metformin due to intolerance.  Add to allergy list.  We will try to get her started on Ozempic as this will help with glucose control as well as weight concerns. - Hemoglobin A1c  5. Chronic anticoagulation Checking CBC with differential.  Continue Eliquis 2.5 mg twice daily. - CBC with Differential/Platelet  6. Perimenopausal vasomotor symptoms Discussed various options for treating perimenopausal vasomotor symptoms.  At this time she would like to hold off on starting medication and will manage with conservative measures.  She will let me know if her symptoms worsen and she would like to consider intervention.  Return in about 6 months (around 12/08/2021) for prediabetes, HTN, anticoagulation follow up. ___________________________________________ Lorraine Sorrel, DNP, APRN, FNP-BC Primary Care and [place]

## 2021-06-11 ENCOUNTER — Telehealth (INDEPENDENT_AMBULATORY_CARE_PROVIDER_SITE_OTHER): Payer: BC Managed Care – PPO | Admitting: Medical-Surgical

## 2021-06-11 ENCOUNTER — Encounter: Payer: Self-pay | Admitting: Medical-Surgical

## 2021-06-11 DIAGNOSIS — E119 Type 2 diabetes mellitus without complications: Secondary | ICD-10-CM

## 2021-06-11 DIAGNOSIS — F4322 Adjustment disorder with anxiety: Secondary | ICD-10-CM | POA: Diagnosis not present

## 2021-06-11 LAB — CBC WITH DIFFERENTIAL/PLATELET
Absolute Monocytes: 940 cells/uL (ref 200–950)
Basophils Absolute: 76 cells/uL (ref 0–200)
Basophils Relative: 0.6 %
Eosinophils Absolute: 394 cells/uL (ref 15–500)
Eosinophils Relative: 3.1 %
HCT: 35.3 % (ref 35.0–45.0)
Hemoglobin: 10.2 g/dL — ABNORMAL LOW (ref 11.7–15.5)
Lymphs Abs: 3391 cells/uL (ref 850–3900)
MCH: 19.3 pg — ABNORMAL LOW (ref 27.0–33.0)
MCHC: 28.9 g/dL — ABNORMAL LOW (ref 32.0–36.0)
MCV: 66.9 fL — ABNORMAL LOW (ref 80.0–100.0)
MPV: 10.6 fL (ref 7.5–12.5)
Monocytes Relative: 7.4 %
Neutro Abs: 7899 cells/uL — ABNORMAL HIGH (ref 1500–7800)
Neutrophils Relative %: 62.2 %
Platelets: 469 10*3/uL — ABNORMAL HIGH (ref 140–400)
RBC: 5.28 10*6/uL — ABNORMAL HIGH (ref 3.80–5.10)
RDW: 17.6 % — ABNORMAL HIGH (ref 11.0–15.0)
Total Lymphocyte: 26.7 %
WBC: 12.7 10*3/uL — ABNORMAL HIGH (ref 3.8–10.8)

## 2021-06-11 LAB — COMPLETE METABOLIC PANEL WITH GFR
AG Ratio: 1.7 (calc) (ref 1.0–2.5)
ALT: 63 U/L — ABNORMAL HIGH (ref 6–29)
AST: 71 U/L — ABNORMAL HIGH (ref 10–30)
Albumin: 4.4 g/dL (ref 3.6–5.1)
Alkaline phosphatase (APISO): 85 U/L (ref 31–125)
BUN: 11 mg/dL (ref 7–25)
CO2: 26 mmol/L (ref 20–32)
Calcium: 10.1 mg/dL (ref 8.6–10.2)
Chloride: 101 mmol/L (ref 98–110)
Creat: 0.69 mg/dL (ref 0.50–0.99)
Globulin: 2.6 g/dL (calc) (ref 1.9–3.7)
Glucose, Bld: 137 mg/dL — ABNORMAL HIGH (ref 65–99)
Potassium: 4.7 mmol/L (ref 3.5–5.3)
Sodium: 138 mmol/L (ref 135–146)
Total Bilirubin: 0.5 mg/dL (ref 0.2–1.2)
Total Protein: 7 g/dL (ref 6.1–8.1)
eGFR: 112 mL/min/{1.73_m2} (ref >=60)

## 2021-06-11 LAB — LIPID PANEL
Cholesterol: 194 mg/dL (ref <200)
HDL: 41 mg/dL — ABNORMAL LOW (ref >=50)
LDL Cholesterol (Calc): 127 mg/dL (calc) — ABNORMAL HIGH
Non-HDL Cholesterol (Calc): 153 mg/dL (calc) — ABNORMAL HIGH (ref <130)
Total CHOL/HDL Ratio: 4.7 (calc) (ref <5.0)
Triglycerides: 147 mg/dL (ref <150)

## 2021-06-11 LAB — HEMOGLOBIN A1C
Hgb A1c MFr Bld: 7.7 % of total Hgb — ABNORMAL HIGH (ref <5.7)
Mean Plasma Glucose: 174 mg/dL
eAG (mmol/L): 9.7 mmol/L

## 2021-06-11 MED ORDER — PRAVASTATIN SODIUM 20 MG PO TABS
20.0000 mg | ORAL_TABLET | Freq: Every day | ORAL | 0 refills | Status: DC
Start: 2021-06-11 — End: 2021-09-11

## 2021-06-11 MED ORDER — BLOOD GLUCOSE MONITOR KIT: PACK | 0 refills | Status: AC

## 2021-06-11 NOTE — Progress Notes (Addendum)
Virtual Visit via Video Note  I connected with Lorraine Lynch on 06/11/2021 at 2:00pm by a video enabled telemedicine application and verified that I am speaking with the correct person using two identifiers.   I discussed the limitations of evaluation and management by telemedicine and the availability of in person appointments. The patient expressed understanding and agreed to proceed.  Patient location: home Provider locations: office   HPI with pertinent ROS:   CC: Lab results  HPI: Pleasant 40-year-old female presenting via MyChart video visit to discuss recent lab results.  Notes that she did have gestational diabetes with her pregnancy and saw her results of 7.7% A1c.  Although a newly diagnosed diabetic, she is already aware of how to check her sugars but she does not have a glucometer at home.  She did get semaglutide that was prescribed for her and is already started her injections at 0.25 mg.  So far, she is tolerating the injection well without side effects.  Metformin was discontinued due to intolerance related to GI issues.  I reviewed the past medical history, family history, social history, surgical history, and allergies today and no changes were needed.  Please see the problem list section below in epic for further details.   Physical exam:   General: Well Developed, well nourished, and in no acute distress.  Neuro: Alert and oriented x3.  HEENT: Normocephalic, atraumatic.  Skin: Warm and dry. Cardiac: Regular rate and rhythm, no murmurs rubs or gallops, no lower extremity edema.  Respiratory: Clear to auscultation bilaterally. Not using accessory muscles, speaking in full sentences.  Impression and Recommendations:    1. Type 2 diabetes mellitus without complication, without long-term current use of insulin (HCC) Reviewed lab results.  She is already started some appetite so this should help get her A1c under control.  Recommend dietary and lifestyle to incorporate  exercise and avoid simple carbohydrates/concentrated sweets.  Already on an ACE inhibitor.  Printed prescription for glucometer placed at the front desk for her to pick up.  Reviewed recommendations for cholesterol management and type II diabetes.  She is agreeable to start a statin.  Because her liver enzymes are slightly elevated, we will start with a lower intensity statin and a low-dose.  Sending pravastatin 20 mg daily.  We will plan to recheck hepatic function and cholesterol in 6 weeks. - pravastatin (PRAVACHOL) 20 MG tablet; Take 1 tablet (20 mg total) by mouth daily.  Dispense: 90 tablet; Refill: 0 - blood glucose meter kit and supplies KIT; Dispense based on patient and insurance preference. Use up to four times daily as directed. Please include lancets, test strips, control solution.  Dispense: 1 each; Refill: 0  Return in about 3 months (around 09/11/2021) for DM follow up. ___________________________________________ Joy L. Jessup, DNP, APRN, FNP-BC Primary Care and Sports Medicine [facility]  

## 2021-06-16 ENCOUNTER — Telehealth: Payer: BC Managed Care – PPO | Admitting: Medical-Surgical

## 2021-06-18 DIAGNOSIS — F4322 Adjustment disorder with anxiety: Secondary | ICD-10-CM | POA: Diagnosis not present

## 2021-06-25 DIAGNOSIS — F4322 Adjustment disorder with anxiety: Secondary | ICD-10-CM | POA: Diagnosis not present

## 2021-07-02 DIAGNOSIS — F4322 Adjustment disorder with anxiety: Secondary | ICD-10-CM | POA: Diagnosis not present

## 2021-07-06 ENCOUNTER — Other Ambulatory Visit: Payer: Self-pay | Admitting: Medical-Surgical

## 2021-07-16 DIAGNOSIS — F4322 Adjustment disorder with anxiety: Secondary | ICD-10-CM | POA: Diagnosis not present

## 2021-07-23 DIAGNOSIS — F4322 Adjustment disorder with anxiety: Secondary | ICD-10-CM | POA: Diagnosis not present

## 2021-07-26 ENCOUNTER — Other Ambulatory Visit: Payer: Self-pay | Admitting: Osteopathic Medicine

## 2021-08-01 ENCOUNTER — Other Ambulatory Visit: Payer: Self-pay | Admitting: Medical-Surgical

## 2021-08-13 DIAGNOSIS — F4322 Adjustment disorder with anxiety: Secondary | ICD-10-CM | POA: Diagnosis not present

## 2021-08-20 DIAGNOSIS — F4322 Adjustment disorder with anxiety: Secondary | ICD-10-CM | POA: Diagnosis not present

## 2021-08-26 ENCOUNTER — Encounter: Payer: Self-pay | Admitting: Medical-Surgical

## 2021-08-27 DIAGNOSIS — F4322 Adjustment disorder with anxiety: Secondary | ICD-10-CM | POA: Diagnosis not present

## 2021-09-03 DIAGNOSIS — F4322 Adjustment disorder with anxiety: Secondary | ICD-10-CM | POA: Diagnosis not present

## 2021-09-04 ENCOUNTER — Encounter: Payer: Self-pay | Admitting: Medical-Surgical

## 2021-09-11 ENCOUNTER — Encounter: Payer: Self-pay | Admitting: Medical-Surgical

## 2021-09-11 ENCOUNTER — Other Ambulatory Visit: Payer: Self-pay

## 2021-09-11 ENCOUNTER — Other Ambulatory Visit: Payer: Self-pay | Admitting: Medical-Surgical

## 2021-09-11 ENCOUNTER — Ambulatory Visit (INDEPENDENT_AMBULATORY_CARE_PROVIDER_SITE_OTHER): Payer: Self-pay | Admitting: Medical-Surgical

## 2021-09-11 VITALS — BP 125/80 | HR 97 | Resp 20 | Ht 66.0 in | Wt 269.0 lb

## 2021-09-11 DIAGNOSIS — R635 Abnormal weight gain: Secondary | ICD-10-CM

## 2021-09-11 DIAGNOSIS — E119 Type 2 diabetes mellitus without complications: Secondary | ICD-10-CM

## 2021-09-11 MED ORDER — SEMAGLUTIDE (1 MG/DOSE) 4 MG/3ML ~~LOC~~ SOPN: 1.0000 mg | PEN_INJECTOR | SUBCUTANEOUS | 1 refills | Status: DC

## 2021-09-11 NOTE — Progress Notes (Signed)
°  HPI with pertinent ROS:   CC: Weight check  HPI: Pleasant 41 year old female presenting today for weight check on Ozempic.  She has been using Ozempic 0.5 mg weekly, tolerating well.  Notes some transient nausea the day after her shot but this is very brief and self resolved without intervention.  Notes a decrease in her appetite overall.  Has not really paid attention to her weight loss and does not usually notice these things until it becomes more drastic.  She does go to the park and do walking at least 3-4 times weekly.  Working on [employer].  Stays busy with her kids.  Had COVID a few weeks ago but has recovered well.  Does still have a lingering cough at night but this is manageable.  I reviewed the past medical history, family history, social history, surgical history, and allergies today and no changes were needed.  Please see the problem list section below in epic for further details.   Physical exam:   General: Well Developed, well nourished, and in no acute distress.  Neuro: Alert and oriented x3.  HEENT: Normocephalic, atraumatic.  Skin: Warm and dry. Cardiac: Regular rate and rhythm, no murmurs rubs or gallops, no lower extremity edema.  Respiratory: Clear to auscultation bilaterally. Not using accessory muscles, speaking in full sentences.  Impression and Recommendations:    1. Weight gain 2.  Type 2 diabetes Has done very well and lost approximately 12 pounds in the last few months.  Increasing Ozempic to 1 mg weekly.  Continue smaller portions and regular exercise.  We will plan to check A1c again in May.  Return in about 3 months (around 12/08/2021) for DM follow up. ___________________________________________ Lorraine Sorrel, DNP, APRN, FNP-BC Primary Care and [place]

## 2021-09-15 ENCOUNTER — Other Ambulatory Visit: Payer: Self-pay | Admitting: Medical-Surgical

## 2021-09-15 ENCOUNTER — Encounter: Payer: Self-pay | Admitting: Medical-Surgical

## 2021-09-15 NOTE — Telephone Encounter (Signed)
I contacted the pharmacy and they have the 1 mg prescription ready for the patient. She hasn't picked it up yet.

## 2021-10-24 ENCOUNTER — Other Ambulatory Visit: Payer: Self-pay | Admitting: Osteopathic Medicine

## 2021-10-24 DIAGNOSIS — D6852 Prothrombin gene mutation: Secondary | ICD-10-CM

## 2021-10-24 DIAGNOSIS — Z86711 Personal history of pulmonary embolism: Secondary | ICD-10-CM

## 2021-10-24 DIAGNOSIS — Z86718 Personal history of other venous thrombosis and embolism: Secondary | ICD-10-CM

## 2021-12-08 ENCOUNTER — Ambulatory Visit: Payer: BC Managed Care – PPO | Admitting: Medical-Surgical

## 2022-01-16 ENCOUNTER — Other Ambulatory Visit: Payer: Self-pay | Admitting: Medical-Surgical

## 2022-01-16 ENCOUNTER — Ambulatory Visit (INDEPENDENT_AMBULATORY_CARE_PROVIDER_SITE_OTHER): Payer: No Typology Code available for payment source | Admitting: Medical-Surgical

## 2022-01-16 ENCOUNTER — Encounter: Payer: Self-pay | Admitting: Medical-Surgical

## 2022-01-16 VITALS — BP 106/68 | HR 98 | Resp 20 | Ht 66.0 in | Wt 261.0 lb

## 2022-01-16 DIAGNOSIS — Z86711 Personal history of pulmonary embolism: Secondary | ICD-10-CM

## 2022-01-16 DIAGNOSIS — Z7901 Long term (current) use of anticoagulants: Secondary | ICD-10-CM

## 2022-01-16 DIAGNOSIS — E119 Type 2 diabetes mellitus without complications: Secondary | ICD-10-CM

## 2022-01-16 DIAGNOSIS — I10 Essential (primary) hypertension: Secondary | ICD-10-CM

## 2022-01-16 DIAGNOSIS — Z86718 Personal history of other venous thrombosis and embolism: Secondary | ICD-10-CM

## 2022-01-16 DIAGNOSIS — D6852 Prothrombin gene mutation: Secondary | ICD-10-CM

## 2022-01-16 DIAGNOSIS — Z716 Tobacco abuse counseling: Secondary | ICD-10-CM | POA: Insufficient documentation

## 2022-01-16 LAB — POCT GLYCOSYLATED HEMOGLOBIN (HGB A1C): HbA1c, POC (prediabetic range): 5.9 % (ref 5.7–6.4)

## 2022-01-16 MED ORDER — SEMAGLUTIDE (2 MG/DOSE) 8 MG/3ML ~~LOC~~ SOPN: 2.0000 mg | PEN_INJECTOR | SUBCUTANEOUS | 1 refills | Status: DC

## 2022-01-16 NOTE — Assessment & Plan Note (Signed)
Continue Eliquis 2.5 mg twice daily as prescribed.

## 2022-01-16 NOTE — Assessment & Plan Note (Signed)
Discussed recommendations for smoking cessation.  She is not at a point where she is considering quitting but she has thought about it in the past.  Reports that she may decide to cut back on how much she smokes but is not sure about this.  Patient verbalized understanding of risks of smoking and benefits of quitting.

## 2022-01-16 NOTE — Assessment & Plan Note (Signed)
POCT hemoglobin A1c 5.9% today.  Patient interested in increasing her semaglutide for weight loss benefits so increasing to 2 mg weekly.

## 2022-01-16 NOTE — Assessment & Plan Note (Signed)
Blood pressure at goal today.  Continue lisinopril 10 mg twice daily as prescribed.  Monitor blood pressure at home with goal of 130/80 or less.  Recommend low-sodium diet, regular intentional exercise, and continued weight loss

## 2022-01-16 NOTE — Progress Notes (Signed)
Established Patient Office Visit  Subjective   Patient ID: Lorraine Lynch, female   DOB: [birth year] Age: 41 y.o. MRN: [id number]   Chief Complaint  Patient presents with   Diabetes   Hypertension    HPI Pleasant 41 year old female presenting today for the following:  DM: taking Ozempic '1mg'$  weekly, tolerating well without side effects. Not checking sugars, never picked up glucometer prescription. Working on diet changes. Weight loss in the last year of 24 lbs.   History of DVT/anticoagulation: Eliquis 2.'5mg'$  BID, tolerating well, without side effects. Will be on this indefinitely.   Hypertension: Taking Lisinopril '10mg'$  BID, tolerating well without side effects. No cuff at home so has not been checking her BP. Doesn't like automatic cuffs. Denies CP, SOB, palpitations, lower extremity edema, dizziness, headaches, or vision changes.  Tobacco abuse: smoking 6-7 cigarettes per day. Contemplating quitting but not ready to commit.    Objective:    Vitals:   01/16/22 0941  BP: 106/68  Pulse: 98  Resp: 20  Height: '5\' 6"'$  (1.676 m)  Weight: 261 lb (118.4 kg)  SpO2: 99%  BMI (Calculated): 42.15     Physical Exam Vitals and nursing note reviewed.  Constitutional:      General: She is not in acute distress.    Appearance: Normal appearance. She is obese. She is not ill-appearing.  HENT:     Head: Normocephalic and atraumatic.  Cardiovascular:     Rate and Rhythm: Normal rate and regular rhythm.     Pulses: Normal pulses.     Heart sounds: Normal heart sounds.  Pulmonary:     Effort: Pulmonary effort is normal. No respiratory distress.     Breath sounds: Normal breath sounds. No wheezing, rhonchi or rales.  Skin:    General: Skin is warm and dry.  Neurological:     Mental Status: She is alert and oriented to person, place, and time.  Psychiatric:        Mood and Affect: Mood normal.        Behavior: Behavior normal.        Thought Content: Thought content normal.         Judgment: Judgment normal.    Results for orders placed or performed in visit on 01/16/22 (from the past 24 hour(s))  POCT HgB A1C     Status: None   Collection Time: 01/16/22  9:48 AM  Result Value Ref Range   Hemoglobin A1C     HbA1c POC (<> result, manual entry)     HbA1c, POC (prediabetic range) 5.9 5.7 - 6.4 %   HbA1c, POC (controlled diabetic range)         The 10-year ASCVD risk score (Arnett DK, et al., 2019) is: 7%   Values used to calculate the score:     Age: 28 years     Sex: Female     Is Non-Hispanic African American: No     Diabetic: Yes     Tobacco smoker: Yes     Systolic Blood Pressure: 440 mmHg     Is BP treated: Yes     HDL Cholesterol: 41 mg/dL     Total Cholesterol: 194 mg/dL   Assessment & Plan:   Essential hypertension Blood pressure at goal today.  Continue lisinopril 10 mg twice daily as prescribed.  Monitor blood pressure at home with goal of 130/80 or less.  Recommend low-sodium diet, regular intentional exercise, and continued weight loss  Type 2 diabetes mellitus without complication,  without long-term current use of insulin (HCC) POCT hemoglobin A1c 5.9% today.  Patient interested in increasing her semaglutide for weight loss benefits so increasing to 2 mg weekly.  History of DVT (deep vein thrombosis) Continue Eliquis 2.5 mg twice daily as prescribed.  Tobacco abuse counseling Discussed recommendations for smoking cessation.  She is not at a point where she is considering quitting but she has thought about it in the past.  Reports that she may decide to cut back on how much she smokes but is not sure about this.  Patient verbalized understanding of risks of smoking and benefits of quitting.   Return in about 6 months (around 07/18/2022) for DM/HTN/HLD follow up.  ___________________________________________ Clearnce Sorrel, DNP, APRN, FNP-BC Primary Care and [place]

## 2022-01-22 ENCOUNTER — Encounter: Payer: Self-pay | Admitting: Medical-Surgical

## 2022-01-23 ENCOUNTER — Encounter: Payer: Self-pay | Admitting: Medical-Surgical

## 2022-01-23 DIAGNOSIS — F900 Attention-deficit hyperactivity disorder, predominantly inattentive type: Secondary | ICD-10-CM | POA: Insufficient documentation

## 2022-02-04 ENCOUNTER — Ambulatory Visit (INDEPENDENT_AMBULATORY_CARE_PROVIDER_SITE_OTHER): Payer: No Typology Code available for payment source | Admitting: Medical-Surgical

## 2022-02-04 ENCOUNTER — Encounter: Payer: Self-pay | Admitting: Medical-Surgical

## 2022-02-04 VITALS — BP 99/63 | HR 98 | Resp 20 | Ht 66.0 in | Wt 263.0 lb

## 2022-02-04 DIAGNOSIS — Z716 Tobacco abuse counseling: Secondary | ICD-10-CM | POA: Diagnosis not present

## 2022-02-04 DIAGNOSIS — F900 Attention-deficit hyperactivity disorder, predominantly inattentive type: Secondary | ICD-10-CM

## 2022-02-04 MED ORDER — AMPHETAMINE-DEXTROAMPHET ER 10 MG PO CP24: 10.0000 mg | ORAL_CAPSULE | Freq: Every day | ORAL | 0 refills | Status: DC

## 2022-02-04 NOTE — Progress Notes (Signed)
   Established Patient Office Visit  Subjective   Patient ID: Lorraine Lynch, female   DOB: Aug 04, 1981 Age: 41 y.o. MRN: [id number]   Chief Complaint  Patient presents with   ADHD   Follow-up    HPI Pleasant 41 year old female presenting today for the following:  ADHD: recent evaluation and diagnosis of ADHD, predominantly inattentive. Has never taken medications for ADHD before. Is interested in trying a medication to see how she feels and if it's helpful.   Tobacco abuse: Continues to smoke about 6 to 7 cigarettes daily.  She is working to cut back even further and actively aiming for smoking cessation.   Objective:    Vitals:   02/04/22 1059  BP: 99/63  Pulse: 98  Resp: 20  Height: '5\' 6"'$  (1.676 m)  Weight: 263 lb (119.3 kg)  SpO2: 100%  BMI (Calculated): 42.47    Physical Exam Vitals and nursing note reviewed.  Constitutional:      General: She is not in acute distress.    Appearance: Normal appearance. She is not ill-appearing.  HENT:     Head: Normocephalic and atraumatic.  Cardiovascular:     Rate and Rhythm: Normal rate and regular rhythm.     Pulses: Normal pulses.  Pulmonary:     Effort: Pulmonary effort is normal. No respiratory distress.  Skin:    General: Skin is warm and dry.  Neurological:     Mental Status: She is alert and oriented to person, place, and time.  Psychiatric:        Mood and Affect: Mood normal.        Behavior: Behavior normal.        Thought Content: Thought content normal.        Judgment: Judgment normal.   No results found for this or any previous visit (from the past 24 hour(s)).     The 10-year ASCVD risk score (Arnett DK, et al., 2019) is: 6.1%   Values used to calculate the score:     Age: 19 years     Sex: Female     Is Non-Hispanic African American: No     Diabetic: Yes     Tobacco smoker: Yes     Systolic Blood Pressure: 99 mmHg     Is BP treated: Yes     HDL Cholesterol: 41 mg/dL     Total Cholesterol: 194  mg/dL   Assessment & Plan:   1. ADHD, predominantly inattentive type Reviewed various options for treatment including counseling, nonstimulant medications, and stimulant medications.  She would like to try a low-dose of a stimulant medication at this time so we are starting Adderall XR 10 mg daily.  Reviewed expectations for effectiveness as well as potential side effects of the medication to monitor for.  2. Tobacco abuse counseling Reviewed current smoking habits and recommendations for cessation.  Patient is actively working to cut back the quantity of cigarettes that she smokes and has made great progress so far.  Follow-up: MyChart message to be sent in 3 weeks to evaluate patient tolerance and the effect of her the low-dose Adderall.  ___________________________________________ Clearnce Sorrel, DNP, APRN, FNP-BC Primary Care and [place]

## 2022-02-26 ENCOUNTER — Other Ambulatory Visit: Payer: Self-pay | Admitting: Medical-Surgical

## 2022-03-02 NOTE — Telephone Encounter (Signed)
Alternative  

## 2022-03-04 ENCOUNTER — Encounter: Payer: Self-pay | Admitting: Medical-Surgical

## 2022-03-14 ENCOUNTER — Other Ambulatory Visit: Payer: Self-pay | Admitting: Medical-Surgical

## 2022-03-14 DIAGNOSIS — E119 Type 2 diabetes mellitus without complications: Secondary | ICD-10-CM

## 2022-03-31 MED ORDER — SEMAGLUTIDE (2 MG/DOSE) 8 MG/3ML ~~LOC~~ SOPN: 2.0000 mg | PEN_INJECTOR | SUBCUTANEOUS | 1 refills | Status: DC

## 2022-04-13 ENCOUNTER — Other Ambulatory Visit: Payer: Self-pay | Admitting: Medical-Surgical

## 2022-04-23 LAB — LIPID PANEL
Cholesterol: 139 mg/dL (ref <200)
HDL: 40 mg/dL — ABNORMAL LOW (ref >=50)
LDL Cholesterol (Calc): 79 mg/dL (calc)
Non-HDL Cholesterol (Calc): 99 mg/dL (calc) (ref <130)
Total CHOL/HDL Ratio: 3.5 (calc) (ref <5.0)
Triglycerides: 122 mg/dL (ref <150)

## 2022-04-23 LAB — COMPLETE METABOLIC PANEL WITH GFR
AG Ratio: 1.4 (calc) (ref 1.0–2.5)
ALT: 16 U/L (ref 6–29)
AST: 13 U/L (ref 10–30)
Albumin: 3.8 g/dL (ref 3.6–5.1)
Alkaline phosphatase (APISO): 71 U/L (ref 31–125)
BUN/Creatinine Ratio: 10 (calc) (ref 6–22)
BUN: 10 mg/dL (ref 7–25)
CO2: 26 mmol/L (ref 20–32)
Calcium: 9.4 mg/dL (ref 8.6–10.2)
Chloride: 108 mmol/L (ref 98–110)
Creat: 1.01 mg/dL — ABNORMAL HIGH (ref 0.50–0.99)
Globulin: 2.8 g/dL (calc) (ref 1.9–3.7)
Glucose, Bld: 84 mg/dL (ref 65–139)
Potassium: 4.3 mmol/L (ref 3.5–5.3)
Sodium: 143 mmol/L (ref 135–146)
Total Bilirubin: 0.3 mg/dL (ref 0.2–1.2)
Total Protein: 6.6 g/dL (ref 6.1–8.1)
eGFR: 72 mL/min/{1.73_m2} (ref >=60)

## 2022-04-23 LAB — CBC WITH DIFFERENTIAL/PLATELET
Absolute Monocytes: 889 cells/uL (ref 200–950)
Basophils Absolute: 46 cells/uL (ref 0–200)
Basophils Relative: 0.4 %
Eosinophils Absolute: 342 cells/uL (ref 15–500)
Eosinophils Relative: 3 %
HCT: 30.1 % — ABNORMAL LOW (ref 35.0–45.0)
Hemoglobin: 8.6 g/dL — ABNORMAL LOW (ref 11.7–15.5)
Lymphs Abs: 2930 cells/uL (ref 850–3900)
MCH: 18.6 pg — ABNORMAL LOW (ref 27.0–33.0)
MCHC: 28.6 g/dL — ABNORMAL LOW (ref 32.0–36.0)
MCV: 65 fL — ABNORMAL LOW (ref 80.0–100.0)
MPV: 10.2 fL (ref 7.5–12.5)
Monocytes Relative: 7.8 %
Neutro Abs: 7193 cells/uL (ref 1500–7800)
Neutrophils Relative %: 63.1 %
Platelets: 421 10*3/uL — ABNORMAL HIGH (ref 140–400)
RBC: 4.63 10*6/uL (ref 3.80–5.10)
RDW: 17.6 % — ABNORMAL HIGH (ref 11.0–15.0)
Total Lymphocyte: 25.7 %
WBC: 11.4 10*3/uL — ABNORMAL HIGH (ref 3.8–10.8)

## 2022-04-23 LAB — TEST AUTHORIZATION

## 2022-04-23 LAB — IRON,TIBC AND FERRITIN PANEL
%SAT: 3 % (calc) — ABNORMAL LOW (ref 16–45)
Ferritin: 3 ng/mL — ABNORMAL LOW (ref 16–232)
Iron: 12 ug/dL — ABNORMAL LOW (ref 40–190)
TIBC: 408 mcg/dL (calc) (ref 250–450)

## 2022-04-23 LAB — CBC MORPHOLOGY

## 2022-04-28 ENCOUNTER — Other Ambulatory Visit: Payer: Self-pay | Admitting: Medical-Surgical

## 2022-04-28 DIAGNOSIS — Z86711 Personal history of pulmonary embolism: Secondary | ICD-10-CM

## 2022-04-28 DIAGNOSIS — Z86718 Personal history of other venous thrombosis and embolism: Secondary | ICD-10-CM

## 2022-04-28 DIAGNOSIS — D6852 Prothrombin gene mutation: Secondary | ICD-10-CM

## 2022-06-22 ENCOUNTER — Encounter: Payer: Self-pay | Admitting: Medical-Surgical

## 2022-06-25 ENCOUNTER — Telehealth: Payer: Self-pay

## 2022-06-25 NOTE — Telephone Encounter (Addendum)
Initiated Prior authorization for: Ozempic (2 MG/DOSE) '8MG'$ /3ML pen-injectors Via: rxbprompt Case/Key:[id number] Status: approved  as of 06/25/22 Reason:this request is approved from 07/08/22-07/08/23 Notified Pt via: Mychart    [id number] Drug/Service Name: OZEMPIC 0.25-0.5 MG/DOSE PEN Patient: Lorraine Lynch Date Requested: 07/08/2022 10:25:07 AM   MemberID: [id number] DOB: [birth year]  spacer Powered by [employer]

## 2022-07-17 ENCOUNTER — Other Ambulatory Visit: Payer: Self-pay | Admitting: Medical-Surgical

## 2022-07-20 ENCOUNTER — Ambulatory Visit: Payer: No Typology Code available for payment source | Admitting: Medical-Surgical

## 2022-07-20 DIAGNOSIS — E119 Type 2 diabetes mellitus without complications: Secondary | ICD-10-CM

## 2022-08-18 ENCOUNTER — Ambulatory Visit (INDEPENDENT_AMBULATORY_CARE_PROVIDER_SITE_OTHER): Payer: BLUE CROSS/BLUE SHIELD | Admitting: Medical-Surgical

## 2022-08-18 ENCOUNTER — Encounter: Payer: Self-pay | Admitting: Medical-Surgical

## 2022-08-18 VITALS — BP 126/78 | HR 95 | Resp 20 | Ht 66.0 in | Wt 252.4 lb

## 2022-08-18 DIAGNOSIS — I10 Essential (primary) hypertension: Secondary | ICD-10-CM

## 2022-08-18 DIAGNOSIS — Z7901 Long term (current) use of anticoagulants: Secondary | ICD-10-CM

## 2022-08-18 DIAGNOSIS — E1169 Type 2 diabetes mellitus with other specified complication: Secondary | ICD-10-CM

## 2022-08-18 DIAGNOSIS — E785 Hyperlipidemia, unspecified: Secondary | ICD-10-CM

## 2022-08-18 DIAGNOSIS — Z23 Encounter for immunization: Secondary | ICD-10-CM | POA: Diagnosis not present

## 2022-08-18 DIAGNOSIS — Z86718 Personal history of other venous thrombosis and embolism: Secondary | ICD-10-CM

## 2022-08-18 DIAGNOSIS — E119 Type 2 diabetes mellitus without complications: Secondary | ICD-10-CM

## 2022-08-18 DIAGNOSIS — Z86711 Personal history of pulmonary embolism: Secondary | ICD-10-CM

## 2022-08-18 DIAGNOSIS — Z716 Tobacco abuse counseling: Secondary | ICD-10-CM

## 2022-08-18 DIAGNOSIS — D6852 Prothrombin gene mutation: Secondary | ICD-10-CM

## 2022-08-18 LAB — POCT GLYCOSYLATED HEMOGLOBIN (HGB A1C): Hemoglobin A1C: 7.4 % — AB (ref 4.0–5.6)

## 2022-08-18 LAB — POCT UA - MICROALBUMIN
Albumin/Creatinine Ratio, Urine, POC: <30
Creatinine, POC: 200 mg/dL
Microalbumin Ur, POC: 30 mg/L

## 2022-08-18 MED ORDER — APIXABAN 2.5 MG PO TABS: 2.5000 mg | ORAL_TABLET | Freq: Two times a day (BID) | ORAL | 2 refills | Status: DC

## 2022-08-18 MED ORDER — LISINOPRIL 10 MG PO TABS: 20.0000 mg | ORAL_TABLET | Freq: Every day | ORAL | 3 refills | Status: DC

## 2022-08-18 MED ORDER — SEMAGLUTIDE (2 MG/DOSE) 8 MG/3ML ~~LOC~~ SOPN: 2.0000 mg | PEN_INJECTOR | SUBCUTANEOUS | 1 refills | Status: DC

## 2022-08-18 MED ORDER — PRAVASTATIN SODIUM 20 MG PO TABS: 20.0000 mg | ORAL_TABLET | Freq: Every day | ORAL | 3 refills | Status: DC

## 2022-08-18 NOTE — Progress Notes (Signed)
Established Patient Office Visit  Subjective   Patient ID: Lorraine Lynch, female   DOB: October 26, 1980 Age: 42 y.o. MRN: [id number]   Chief Complaint  Patient presents with   Follow-up   Diabetes   HPI Pleasant 42 year old female presenting today for the following:  Hypertension: Taking lisinopril 10 mg daily, tolerating well without side effects.  Not checking blood pressure at home.  Still uses salt when cooking but does not overly salt her foods.  She is working to be more physically active and has gotten a Herbalist for use when working from home.  Also has a small stepper that she is starting to use.  Of note, she has lost approximately 30 pounds in the last 12-14 months. Denies CP, SOB, palpitations, lower extremity edema, dizziness, headaches, or vision changes.   Diabetes: Taking semaglutide 2 mg weekly, tolerating well although does have some transient nausea that comes and flares and resolves quickly.  Unfortunately, her insurance has a very high deductible and she was unable to afford her Ozempic.  She was off of the medication completely for 6-7 weeks between November and December.  She does not check her sugars at home and was not taking any other medications during this time.  Has been given a couple of prescriptions for glucometers however has not gotten one for home use.  Hyperlipidemia: Taking pravastatin 20 mg daily, tolerating well without side effects.  Tobacco use counseling: Continues to smoke cigarettes on a daily basis.  Estimates less than half a pack per day.  Not at a point that she is ready to quit but is aware that there are options that are out there to help.  Anticoagulation: Taking Eliquis 2.5 mg twice daily as prescribed, tolerating well without side effects.  Notes that this medication is quite expensive as well and there are times when it is difficult to afford.  So far, she has been able to continue it without missing doses.  Admits that she uses  ibuprofen every now and then but her dosing is very rare.  Objective:    Vitals:   08/18/22 0827  BP: 126/78  Pulse: 95  Resp: 20  Height: '5\' 6"'$  (1.676 m)  Weight: 252 lb 6.4 oz (114.5 kg)  SpO2: 98%  BMI (Calculated): 40.76    Physical Exam Vitals and nursing note reviewed.  Constitutional:      General: She is not in acute distress.    Appearance: Normal appearance. She is obese. She is not ill-appearing.  HENT:     Head: Normocephalic and atraumatic.  Cardiovascular:     Rate and Rhythm: Normal rate and regular rhythm.     Pulses: Normal pulses.     Heart sounds: Normal heart sounds.  Pulmonary:     Effort: Pulmonary effort is normal. No respiratory distress.     Breath sounds: Normal breath sounds. No wheezing, rhonchi or rales.  Skin:    General: Skin is warm and dry.  Neurological:     Mental Status: She is alert and oriented to person, place, and time.  Psychiatric:        Mood and Affect: Mood normal.        Behavior: Behavior normal.        Thought Content: Thought content normal.        Judgment: Judgment normal.    No results found for this or any previous visit (from the past 24 hour(s)).     The 10-year ASCVD risk score (  Arnett DK, et al., 2019) is: 5.4%   Values used to calculate the score:     Age: 69 years     Sex: Female     Is Non-Hispanic African American: No     Diabetic: Yes     Tobacco smoker: Yes     Systolic Blood Pressure: 185 mmHg     Is BP treated: Yes     HDL Cholesterol: 40 mg/dL     Total Cholesterol: 139 mg/dL   Assessment & Plan:   1. Type 2 diabetes mellitus without complication, without long-term current use of insulin (HCC) POCT hemoglobin A1c 7.4% today, up from 5.9% 6 months ago.  This is expected given that she was off of Ozempic for 6-7 weeks.  She has been back on Ozempic for 2 weeks so would like for her to continue this weekly.  If unable to afford this in the future, would like for her to reach out for alternative  medication during that time.  Recommend that she gets a glucometer for checking sugars at home.  POCT microalbumin normal.  Recommend updating eye exam for diabetic care. - POCT UA - Microalbumin - pravastatin (PRAVACHOL) 20 MG tablet; Take 1 tablet (20 mg total) by mouth daily.  Dispense: 90 tablet; Refill: 3  2. Essential hypertension Blood pressure at goal today.  Continue lisinopril 10 mg daily.  Recommend monitoring blood pressure at home with a goal of 130/80 or less.  Low-sodium diet and regular intentional exercise with a goal for weight loss to healthy weight recommended.  3. Tobacco abuse counseling Discussed continued tobacco use.  She is aware of risks of smoking and benefits of quitting.  She is also aware of options that are available for help.  Not at a point that she is ready to quit but will let me know when she is.  4. Hyperlipidemia associated with type 2 diabetes mellitus (HCC) Continue pravastatin 20 mg daily.  5. On continuous oral anticoagulation 6. 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 History of pulmonary embolism 8. History of DVT (deep vein thrombosis) Continue Eliquis 2.5 mg twice daily as prescribed.  Advised to avoid ibuprofen and other NSAIDs as this increases the risk for GI bleeding.  Okay to use Tylenol as needed for pain. - apixaban (ELIQUIS) 2.5 MG TABS tablet; Take 1 tablet (2.5 mg total) by mouth 2 (two) times daily.  Dispense: 60 tablet; Refill: 2  Return in about 3 months (around 11/17/2022) for DM follow up.  ___________________________________________ Clearnce Sorrel, DNP, APRN, FNP-BC Primary Care and [place]

## 2022-11-17 ENCOUNTER — Encounter: Payer: Self-pay | Admitting: Medical-Surgical

## 2022-11-17 ENCOUNTER — Ambulatory Visit (INDEPENDENT_AMBULATORY_CARE_PROVIDER_SITE_OTHER): Payer: BLUE CROSS/BLUE SHIELD | Admitting: Medical-Surgical

## 2022-11-17 VITALS — BP 125/76 | HR 70 | Resp 20 | Ht 66.0 in | Wt 249.6 lb

## 2022-11-17 DIAGNOSIS — Z1283 Encounter for screening for malignant neoplasm of skin: Secondary | ICD-10-CM | POA: Insufficient documentation

## 2022-11-17 DIAGNOSIS — I10 Essential (primary) hypertension: Secondary | ICD-10-CM | POA: Diagnosis not present

## 2022-11-17 DIAGNOSIS — D6852 Prothrombin gene mutation: Secondary | ICD-10-CM

## 2022-11-17 DIAGNOSIS — E119 Type 2 diabetes mellitus without complications: Secondary | ICD-10-CM | POA: Diagnosis not present

## 2022-11-17 DIAGNOSIS — Z86711 Personal history of pulmonary embolism: Secondary | ICD-10-CM

## 2022-11-17 DIAGNOSIS — Z86718 Personal history of other venous thrombosis and embolism: Secondary | ICD-10-CM

## 2022-11-17 LAB — POCT GLYCOSYLATED HEMOGLOBIN (HGB A1C): Hemoglobin A1C: 5.5 % (ref 4.0–5.6)

## 2022-11-17 MED ORDER — APIXABAN 2.5 MG PO TABS: 2.5000 mg | ORAL_TABLET | Freq: Two times a day (BID) | ORAL | 3 refills | Status: DC

## 2022-11-17 MED ORDER — TIRZEPATIDE 7.5 MG/0.5ML ~~LOC~~ SOAJ: 7.5000 mg | SUBCUTANEOUS | 0 refills | Status: DC

## 2022-11-17 NOTE — Progress Notes (Signed)
        Established patient visit  History, exam, impression, and plan:  1. Prothrombin gene mutation 2. History of pulmonary embolism 3. History of DVT (deep vein thrombosis) Has been on Eliquis 2.5 mg twice daily for chronic treatment of DVT/PE/prothrombin gene mutation.  Doing well on the medication without any significant side effects.  No concerns for easy bruisability.  Understands with the precautions.  Continue Eliquis 2.5 mg twice daily. - apixaban (ELIQUIS) 2.5 MG TABS tablet; Take 1 tablet (2.5 mg total) by mouth 2 (two) times daily.  Dispense: 180 tablet; Refill: 3  4. Type 2 diabetes mellitus without complication, without long-term current use of insulin POCT hemoglobin A1c 5.5% today, down from 7.4% in January.  Has been taking Ozempic 2 mg weekly, tolerating well without side effects.  Feels that the medication is not as effective for appetite suppression and weight loss as it was when she first started it.  Is interested in increasing her dose.  Not checking blood sugars at home.  Up-to-date on diabetic eye care.  Currently treated with statin and ACE inhibitor.  Discussed dosing recommendations for Ozempic.  Unable to increase the dose that she is at the maximum already.  Reviewed other options.  She would like to try switching to [facility].  Discontinue Ozempic.  Start Mounjaro 7.5 mg weekly for 4 weeks.  Let me know in 3-4 weeks how she is tolerating the medication and if she feels like it is working well for her.  At that time, we will discuss increasing to 10 mg weekly.  Advised to monitor blood sugars a few times weekly fasting to avoid hypoglycemic events and make sure the medication is working well. - POCT HgB A1C  5. Skin cancer screening She does have several places on her back that are a bit concerning.  Her daughter has showed her pictures on her cell phone.  Unfortunately she is not able to see the area to notate any changes in size/shape/color.  She does have a larger  bicolored lesion in the center of her back that has been present most of her life.  No concerning findings on exam today.  Referring to dermatology for annual skin cancer screening. - Ambulatory referral to Dermatology  6. Essential hypertension Blood pressure at goal today.  Taking lisinopril 10 mg daily, tolerating well without side effects.  Not regularly monitoring blood pressure.  Following a low-sodium diet for the most part.  No regular exercise.  Heart rate regular, normal S1-S2.  No peripheral edema.  Continue lisinopril 10 mg daily as prescribed.  Procedures performed this visit: None.  Return in about 6 months (around 05/19/2023) for chronic disease follow up.  __________________________________ Thayer Ohm, DNP, APRN, FNP-BC Primary Care and Sports Medicine [facility] [place]

## 2022-11-23 ENCOUNTER — Telehealth: Payer: Self-pay

## 2022-11-23 NOTE — Telephone Encounter (Signed)
Patient dropped off medical release form to be filled out. Place in Joy's basket.

## 2023-01-07 ENCOUNTER — Ambulatory Visit (INDEPENDENT_AMBULATORY_CARE_PROVIDER_SITE_OTHER): Payer: BLUE CROSS/BLUE SHIELD | Admitting: Medical-Surgical

## 2023-01-07 ENCOUNTER — Encounter: Payer: Self-pay | Admitting: Medical-Surgical

## 2023-01-07 VITALS — BP 119/78 | HR 99 | Resp 20 | Ht 66.0 in | Wt 256.6 lb

## 2023-01-07 DIAGNOSIS — J020 Streptococcal pharyngitis: Secondary | ICD-10-CM | POA: Diagnosis not present

## 2023-01-07 DIAGNOSIS — J0100 Acute maxillary sinusitis, unspecified: Secondary | ICD-10-CM | POA: Diagnosis not present

## 2023-01-07 DIAGNOSIS — J029 Acute pharyngitis, unspecified: Secondary | ICD-10-CM

## 2023-01-07 LAB — POCT RAPID STREP A (OFFICE): Rapid Strep A Screen: POSITIVE — AB

## 2023-01-07 MED ORDER — AMOXICILLIN-POT CLAVULANATE 875-125 MG PO TABS: 1.0000 | ORAL_TABLET | Freq: Two times a day (BID) | ORAL | 0 refills | Status: DC

## 2023-01-07 NOTE — Progress Notes (Addendum)
        Established patient visit  History, exam, impression, and plan:  1. Sore throat Sore throat for a little over 2 weeks.  POCT rapid strep positive.  See below. - POCT rapid strep A  2. Strep pharyngitis 3. Acute non-recurrent maxillary sinusitis Pleasant 42 year old female presenting with complaints of a sore throat that developed 2 weeks ago.  This lasted for approximately 3 days before spontaneously resolving.  Couple days later, her sore throat presumed and has remained.  Additionally, she has developed sinus congestion, rhinorrhea, productive cough, and ear pressure bilaterally.  Has not been taking any over-the-counter medications for this.  See below for physical exam.  Greater than 2 weeks of symptoms with maxillary sinus tenderness, cervical lymphadenopathy, and green nasal discharge.  Strep a positive.  Treating with Augmentin twice daily x 10 days.  Physical Exam Vitals reviewed.  Constitutional:      General: She is not in acute distress.    Appearance: Normal appearance.  HENT:     Head: Normocephalic and atraumatic.     Right Ear: Ear canal and external ear normal. A middle ear effusion is present.     Left Ear: Ear canal and external ear normal. A middle ear effusion is present.     Nose: Nose normal.     Right Turbinates: Not enlarged, swollen or pale.     Left Turbinates: Not enlarged, swollen or pale.     Mouth/Throat:     Lips: Pink.     Mouth: Mucous membranes are moist.     Pharynx: Uvula midline. Posterior oropharyngeal erythema present. No oropharyngeal exudate.     Tonsils: No tonsillar exudate.  Cardiovascular:     Rate and Rhythm: Normal rate and regular rhythm.     Pulses: Normal pulses.     Heart sounds: Normal heart sounds. No murmur heard.    No friction rub. No gallop.  Pulmonary:     Effort: Pulmonary effort is normal. No respiratory distress.     Breath sounds: Normal breath sounds. No wheezing.  Skin:    General: Skin is warm and dry.   Neurological:     Mental Status: She is alert and oriented to person, place, and time.  Psychiatric:        Mood and Affect: Mood normal.        Behavior: Behavior normal.        Thought Content: Thought content normal.        Judgment: Judgment normal.    Procedures performed this visit: None.  Return if symptoms worsen or fail to improve.  __________________________________ Thayer Ohm, DNP, APRN, FNP-BC Primary Care and Sports Medicine [facility] [place]

## 2023-04-03 LAB — COMPREHENSIVE METABOLIC PANEL: eGFR: 76

## 2023-04-05 ENCOUNTER — Telehealth: Payer: Self-pay | Admitting: General Practice

## 2023-04-05 NOTE — Transitions of Care (Post Inpatient/ED Visit) (Signed)
   04/05/2023  Name: Lorraine Lynch MRN: [id number] DOB: [birth year]  Today's TOC FU Call Status: Today's TOC FU Call Status:: Unsuccessful Call (1st Attempt) Unsuccessful Call (1st Attempt) Date: 04/05/23  Attempted to reach the patient regarding the most recent Inpatient/ED visit.  Follow Up Plan: Additional outreach attempts will be made to reach the patient to complete the Transitions of Care (Post Inpatient/ED visit) call.   Signature Modesto Charon, Control and instrumentation engineer

## 2023-04-06 NOTE — Transitions of Care (Post Inpatient/ED Visit) (Signed)
   04/06/2023  Name: Lorraine Lynch MRN: [id number] DOB: 1980-08-23  Today's TOC FU Call Status: Today's TOC FU Call Status:: Unsuccessful Call (2nd Attempt) Unsuccessful Call (1st Attempt) Date: 04/05/23 Unsuccessful Call (2nd Attempt) Date: 04/06/23  Attempted to reach the patient regarding the most recent Inpatient/ED visit.  Follow Up Plan: Additional outreach attempts will be made to reach the patient to complete the Transitions of Care (Post Inpatient/ED visit) call.   Signature Modesto Charon, Control and instrumentation engineer

## 2023-04-07 ENCOUNTER — Encounter: Payer: Self-pay | Admitting: Medical-Surgical

## 2023-04-07 ENCOUNTER — Ambulatory Visit (INDEPENDENT_AMBULATORY_CARE_PROVIDER_SITE_OTHER): Payer: BLUE CROSS/BLUE SHIELD | Admitting: Medical-Surgical

## 2023-04-07 VITALS — BP 117/77 | HR 73 | Ht 66.0 in | Wt 259.0 lb

## 2023-04-07 DIAGNOSIS — Z09 Encounter for follow-up examination after completed treatment for conditions other than malignant neoplasm: Secondary | ICD-10-CM

## 2023-04-07 DIAGNOSIS — Z23 Encounter for immunization: Secondary | ICD-10-CM | POA: Diagnosis not present

## 2023-04-07 DIAGNOSIS — E119 Type 2 diabetes mellitus without complications: Secondary | ICD-10-CM | POA: Diagnosis not present

## 2023-04-07 DIAGNOSIS — N83202 Unspecified ovarian cyst, left side: Secondary | ICD-10-CM

## 2023-04-07 DIAGNOSIS — I824Y1 Acute embolism and thrombosis of unspecified deep veins of right proximal lower extremity: Secondary | ICD-10-CM | POA: Diagnosis not present

## 2023-04-07 DIAGNOSIS — Z7985 Long-term (current) use of injectable non-insulin antidiabetic drugs: Secondary | ICD-10-CM

## 2023-04-07 MED ORDER — OZEMPIC (0.25 OR 0.5 MG/DOSE) 2 MG/3ML ~~LOC~~ SOPN: 0.5000 mg | PEN_INJECTOR | SUBCUTANEOUS | 1 refills | Status: DC

## 2023-04-07 NOTE — Progress Notes (Signed)
        Established patient visit  History, exam, impression, and plan:  1. Hospital discharge follow-up 2. Left ovarian cyst Lorraine Lynch 42 year old female presenting today for hospital discharge follow-up after experiencing sudden onset left lower quadrant pain that woke her in the middle of the night.  She was evaluated at the ED on 04/03/2023 with overall reassuring findings.  CT abdomen/pelvis showed a persistent left ovarian cyst that has grown since last imaging in 2019.  Notes that this has been giving her a problem for a number of years and with every menstruation, she deals with significant pain.  She has discussed treatment in the past with OB/GYN however this did not seem urgent and she was lost to follow-up.  Today reports that she is still having some pain in the left lower quadrant.  She is using ibuprofen which seems to help a little.  After discussion, she is interested in pursuing surgical treatment.  Referring to OB/GYN to get her reconnected. - Ambulatory referral to Obstetrics / Gynecology  3. Need for influenza vaccination Flu vaccine given in office today. - Flu vaccine trivalent PF, 6mos and older(Flulaval,Afluria,Fluarix,Fluzone)  4. Acute deep vein thrombosis (DVT) of proximal vein of right lower extremity (HCC) Currently asymptomatic and doing well.  Taking Eliquis 2.5 mg twice daily as prescribed.  5. Type 2 diabetes mellitus without complication, without long-term current use of insulin (HCC) Was unable to get [facility].  Has continued Ozempic 2 mg weekly however this is causing significant side effects and she is unable to tolerate taking it every 7 days.  Has been spacing this out.  Not checking sugars at home.  Per patient request, reducing Ozempic to 0.5 mg weekly to enhance compliance and tolerance.  Foot exam completed today.  She has an appointment for her diabetic eye exam scheduled in November.  Plan to follow-up on A1c in October as scheduled. - HM Diabetes Foot  Exam   Procedures performed this visit: None.  Return if symptoms worsen or fail to improve.  __________________________________ Thayer Ohm, DNP, APRN, FNP-BC Primary Care and Sports Medicine [facility] [place]

## 2023-04-07 NOTE — Transitions of Care (Post Inpatient/ED Visit) (Signed)
   04/07/2023  Name: Lorraine Lynch MRN: [id number] DOB: 29-May-1981  Today's TOC FU Call Status: Today's TOC FU Call Status:: Unsuccessful Call (3rd Attempt) Unsuccessful Call (1st Attempt) Date: 04/05/23 Unsuccessful Call (2nd Attempt) Date: 04/06/23 Unsuccessful Call (3rd Attempt) Date: 04/07/23  Attempted to reach the patient regarding the most recent Inpatient/ED visit.  Follow Up Plan: No further outreach attempts will be made at this time. We have been unable to contact the patient.  Signature Modesto Charon, Control and instrumentation engineer

## 2023-05-05 ENCOUNTER — Encounter: Payer: Self-pay | Admitting: Dermatology

## 2023-05-05 ENCOUNTER — Ambulatory Visit: Payer: BLUE CROSS/BLUE SHIELD | Admitting: Dermatology

## 2023-05-05 VITALS — BP 120/69 | HR 97

## 2023-05-05 DIAGNOSIS — L814 Other melanin hyperpigmentation: Secondary | ICD-10-CM

## 2023-05-05 DIAGNOSIS — W908XXA Exposure to other nonionizing radiation, initial encounter: Secondary | ICD-10-CM

## 2023-05-05 DIAGNOSIS — D2372 Other benign neoplasm of skin of left lower limb, including hip: Secondary | ICD-10-CM | POA: Diagnosis not present

## 2023-05-05 DIAGNOSIS — Z1283 Encounter for screening for malignant neoplasm of skin: Secondary | ICD-10-CM

## 2023-05-05 DIAGNOSIS — D1801 Hemangioma of skin and subcutaneous tissue: Secondary | ICD-10-CM

## 2023-05-05 DIAGNOSIS — L720 Epidermal cyst: Secondary | ICD-10-CM

## 2023-05-05 DIAGNOSIS — Z808 Family history of malignant neoplasm of other organs or systems: Secondary | ICD-10-CM

## 2023-05-05 DIAGNOSIS — L578 Other skin changes due to chronic exposure to nonionizing radiation: Secondary | ICD-10-CM

## 2023-05-05 DIAGNOSIS — D239 Other benign neoplasm of skin, unspecified: Secondary | ICD-10-CM

## 2023-05-05 DIAGNOSIS — L821 Other seborrheic keratosis: Secondary | ICD-10-CM

## 2023-05-05 DIAGNOSIS — D229 Melanocytic nevi, unspecified: Secondary | ICD-10-CM

## 2023-05-05 NOTE — Patient Instructions (Signed)

## 2023-05-05 NOTE — Progress Notes (Signed)
   New Patient Visit   Subjective  Lorraine Lynch is a 42 y.o. female who presents for the following: Skin Cancer Screening and Full Body Skin Exam  The patient presents for Total-Body Skin Exam (TBSE) for skin cancer screening and mole check. The patient has spots, moles and lesions to be evaluated, some may be new or changing. Pt has no hx of skin cancer but family hx of NMSC  The following portions of the chart were reviewed this encounter and updated as appropriate: medications, allergies, medical history  Review of Systems:  No other skin or systemic complaints except as noted in HPI or Assessment and Plan.  Objective  Well appearing patient in no apparent distress; mood and affect are within normal limits.  A full examination was performed including scalp, head, eyes, ears, nose, lips, neck, chest, axillae, abdomen, back, buttocks, bilateral upper extremities, bilateral lower extremities, hands, feet, fingers, toes, fingernails, and toenails. All findings within normal limits unless otherwise noted below.   Relevant physical exam findings are noted in the Assessment and Plan.   Assessment & Plan   SKIN CANCER SCREENING PERFORMED TODAY.  ACTINIC DAMAGE - Chronic condition, secondary to cumulative UV/sun exposure - diffuse scaly erythematous macules with underlying dyspigmentation - Recommend daily broad spectrum sunscreen SPF 30+ to sun-exposed areas, reapply every 2 hours as needed.  - Staying in the shade or wearing long sleeves, sun glasses (UVA+UVB protection) and wide brim hats (4-inch brim around the entire circumference of the hat) are also recommended for sun protection.  - Call for new or changing lesions.  MELANOCYTIC NEVI - Tan-brown and/or pink-flesh-colored symmetric macules and papules - Benign appearing on exam today - Observation - Call clinic for new or changing moles - Recommend daily use of broad spectrum spf 30+ sunscreen to sun-exposed areas.    SEBORRHEIC KERATOSIS - Stuck-on, waxy, tan-brown papules and/or plaques  - Benign-appearing - Discussed benign etiology and prognosis. - Observe - Call for any changes  LENTIGINES Exam: scattered tan macules Due to sun exposure Treatment Plan: Benign-appearing, observe. Recommend daily broad spectrum sunscreen SPF 30+ to sun-exposed areas, reapply every 2 hours as needed.  Call for any changes    HEMANGIOMA Exam: red papule(s) Discussed benign nature. Recommend observation. Call for changes.   DERMATOFIBROMA left lower let Exam: Firm pink/brown papulenodule with dimple sign. Treatment Plan: A dermatofibroma is a benign growth possibly related to trauma, such as an insect bite, cut from shaving, or inflamed acne-type bump.  Treatment options to remove include shave or excision with resulting scar and risk of recurrence.  Since benign-appearing and not bothersome, will observe for now.    Milia - tiny firm white papules - type of cyst - benign - sometimes these will clear with nightly OTC adapalene/Differin 0.1% gel or retinol. - may be extracted if symptomatic - observe  Pt will call back for cost of extraction  Return in about 2 years (around 05/04/2025) for TBSE.  I, Tillie Fantasia, CMA, am acting as scribe for Gwenith Daily, MD.   Documentation: I have reviewed the above documentation for accuracy and completeness, and I agree with the above.  Gwenith Daily, MD

## 2023-05-19 ENCOUNTER — Encounter: Payer: Medicaid Other | Admitting: Medical-Surgical

## 2023-08-16 ENCOUNTER — Ambulatory Visit: Payer: Medicaid Other | Admitting: Medical-Surgical

## 2023-08-16 NOTE — Progress Notes (Deleted)
   Established patient visit  History, exam, impression, and plan:  No problem-specific Assessment & Plan notes found for this encounter.   ROS  Physical Exam  Procedures performed this visit: None.  No follow-ups on file.  __________________________________ Thayer Ohm, DNP, APRN, FNP-BC Primary Care and Sports Medicine [facility] [place]

## 2023-08-19 ENCOUNTER — Encounter: Payer: BLUE CROSS/BLUE SHIELD | Admitting: Medical-Surgical

## 2023-09-02 ENCOUNTER — Encounter: Payer: Self-pay | Admitting: Medical-Surgical

## 2023-09-02 ENCOUNTER — Ambulatory Visit (INDEPENDENT_AMBULATORY_CARE_PROVIDER_SITE_OTHER): Payer: BLUE CROSS/BLUE SHIELD | Admitting: Medical-Surgical

## 2023-09-02 VITALS — BP 124/78 | HR 91 | Resp 20 | Ht 66.0 in | Wt 277.2 lb

## 2023-09-02 DIAGNOSIS — E1169 Type 2 diabetes mellitus with other specified complication: Secondary | ICD-10-CM | POA: Diagnosis not present

## 2023-09-02 DIAGNOSIS — E785 Hyperlipidemia, unspecified: Secondary | ICD-10-CM

## 2023-09-02 DIAGNOSIS — Z1231 Encounter for screening mammogram for malignant neoplasm of breast: Secondary | ICD-10-CM

## 2023-09-02 DIAGNOSIS — Z86711 Personal history of pulmonary embolism: Secondary | ICD-10-CM

## 2023-09-02 DIAGNOSIS — R091 Pleurisy: Secondary | ICD-10-CM

## 2023-09-02 DIAGNOSIS — E119 Type 2 diabetes mellitus without complications: Secondary | ICD-10-CM | POA: Diagnosis not present

## 2023-09-02 DIAGNOSIS — I10 Essential (primary) hypertension: Secondary | ICD-10-CM

## 2023-09-02 DIAGNOSIS — R635 Abnormal weight gain: Secondary | ICD-10-CM

## 2023-09-02 DIAGNOSIS — Z7985 Long-term (current) use of injectable non-insulin antidiabetic drugs: Secondary | ICD-10-CM

## 2023-09-02 DIAGNOSIS — Z86718 Personal history of other venous thrombosis and embolism: Secondary | ICD-10-CM

## 2023-09-02 DIAGNOSIS — Z124 Encounter for screening for malignant neoplasm of cervix: Secondary | ICD-10-CM

## 2023-09-02 DIAGNOSIS — D6852 Prothrombin gene mutation: Secondary | ICD-10-CM

## 2023-09-02 DIAGNOSIS — Z0000 Encounter for general adult medical examination without abnormal findings: Secondary | ICD-10-CM

## 2023-09-02 LAB — POCT GLYCOSYLATED HEMOGLOBIN (HGB A1C)
HbA1c, POC (controlled diabetic range): 7.3 % — AB (ref 0.0–7.0)
Hemoglobin A1C: 7.3 % — AB (ref 4.0–5.6)

## 2023-09-02 LAB — POCT UA - MICROALBUMIN
Albumin/Creatinine Ratio, Urine, POC: <30
Creatinine, POC: 100 mg/dL
Microalbumin Ur, POC: 30 mg/L

## 2023-09-02 MED ORDER — PRAVASTATIN SODIUM 20 MG PO TABS: 20.0000 mg | ORAL_TABLET | Freq: Every day | ORAL | 3 refills | Status: DC

## 2023-09-02 MED ORDER — BUPROPION HCL ER (XL) 150 MG PO TB24: 150.0000 mg | ORAL_TABLET | Freq: Every day | ORAL | 1 refills | Status: DC

## 2023-09-02 MED ORDER — OZEMPIC (0.25 OR 0.5 MG/DOSE) 2 MG/3ML ~~LOC~~ SOPN: 0.5000 mg | PEN_INJECTOR | SUBCUTANEOUS | 1 refills | Status: DC

## 2023-09-02 MED ORDER — LISINOPRIL 10 MG PO TABS: 20.0000 mg | ORAL_TABLET | Freq: Every day | ORAL | 3 refills | Status: DC

## 2023-09-02 MED ORDER — APIXABAN 2.5 MG PO TABS: 2.5000 mg | ORAL_TABLET | Freq: Two times a day (BID) | ORAL | 3 refills | Status: DC

## 2023-09-02 NOTE — Progress Notes (Signed)
Complete physical exam  Patient: Lorraine Lynch   DOB: 1980/11/23   42 y.o. Female  MRN: [id number]  Subjective:    Chief Complaint  Patient presents with   Annual Exam   Diabetes    Lorraine Lynch is a 43 y.o. female who presents today for a complete physical exam. She reports consuming a  Mediterranean  diet.  Exercising 20 minutes a day doing walking and vibration plate.  She generally feels well. She reports sleeping well. She does not have additional problems to discuss today.   Most recent fall risk assessment:    04/07/2023    8:17 AM  Fall Risk   Falls in the past year? 0  Number falls in past yr: 0  Injury with Fall? 0  Risk for fall due to : No Fall Risks  Follow up Falls evaluation completed     Most recent depression screenings:    09/02/2023    3:00 PM 04/07/2023    8:17 AM  PHQ 2/9 Scores  PHQ - 2 Score 0 0    Vision:Within last year and Dental: No current dental problems and Receives regular dental care    Patient Care Team: Christen Butter, NP as PCP - General (Nurse Practitioner)   Outpatient Medications Prior to Visit  Medication Sig   blood glucose meter kit and supplies KIT Dispense based on patient and insurance preference. Use up to four times daily as directed. Please include lancets, test strips, control solution.   ibuprofen (ADVIL,MOTRIN) 200 MG tablet Take 400 mg by mouth 2 (two) times daily as needed for headache or moderate pain.   [DISCONTINUED] apixaban (ELIQUIS) 2.5 MG TABS tablet Take 1 tablet (2.5 mg total) by mouth 2 (two) times daily.   [DISCONTINUED] lisinopril (ZESTRIL) 10 MG tablet Take 2 tablets (20 mg total) by mouth daily.   [DISCONTINUED] pravastatin (PRAVACHOL) 20 MG tablet Take 1 tablet (20 mg total) by mouth daily.   [DISCONTINUED] Semaglutide,0.25 or 0.5MG /DOS, (OZEMPIC, 0.25 OR 0.5 MG/DOSE,) 2 MG/3ML SOPN Inject 0.5 mg into the skin once a week.   No facility-administered medications prior to visit.    Review of  Systems  Constitutional:  Negative for chills, fever, malaise/fatigue and weight loss.  HENT:  Positive for ear pain, sinus pain and sore throat. Negative for congestion and hearing loss.   Eyes:  Negative for blurred vision, photophobia and pain.  Respiratory:  Positive for cough. Negative for shortness of breath and wheezing.        Since recent illness, reports intermittent pain with deep breathing  Cardiovascular:  Negative for chest pain, palpitations and leg swelling.  Gastrointestinal:  Negative for abdominal pain, constipation, diarrhea, heartburn, nausea and vomiting.  Genitourinary:  Negative for dysuria, frequency and urgency.  Musculoskeletal:  Negative for falls and neck pain.  Skin:  Negative for itching and rash.  Neurological:  Negative for dizziness, weakness and headaches.  Endo/Heme/Allergies:  Negative for polydipsia. Does not bruise/bleed easily.  Psychiatric/Behavioral:  Negative for depression, substance abuse and suicidal ideas. The patient is not nervous/anxious and does not have insomnia.      Objective:     BP 124/78 (BP Location: Right Arm, Cuff Size: Large)   Pulse 91   Resp 20   Ht 5\' 6"  (1.676 m)   Wt 277 lb 3.2 oz (125.7 kg)   SpO2 97%   BMI 44.74 kg/m    Physical Exam Vitals reviewed.  Constitutional:      General: She  is not in acute distress.    Appearance: Normal appearance. She is obese. She is not ill-appearing.  HENT:     Head: Normocephalic and atraumatic.     Right Ear: Tympanic membrane, ear canal and external ear normal. There is no impacted cerumen.     Left Ear: Tympanic membrane, ear canal and external ear normal. There is no impacted cerumen.     Nose: Nose normal. No congestion or rhinorrhea.     Mouth/Throat:     Mouth: Mucous membranes are moist.     Pharynx: No oropharyngeal exudate or posterior oropharyngeal erythema.  Eyes:     General: No scleral icterus.       Right eye: No discharge.        Left eye: No discharge.      Extraocular Movements: Extraocular movements intact.     Conjunctiva/sclera: Conjunctivae normal.     Pupils: Pupils are equal, round, and reactive to light.  Neck:     Thyroid: No thyromegaly.     Vascular: No carotid bruit or JVD.     Trachea: Trachea normal.  Cardiovascular:     Rate and Rhythm: Normal rate and regular rhythm.     Pulses: Normal pulses.     Heart sounds: Normal heart sounds. No murmur heard.    No friction rub. No gallop.  Pulmonary:     Effort: Pulmonary effort is normal. No respiratory distress.     Breath sounds: Normal breath sounds. No wheezing.  Abdominal:     General: Bowel sounds are normal. There is no distension.     Palpations: Abdomen is soft.     Tenderness: There is no abdominal tenderness. There is no guarding.  Musculoskeletal:        General: Normal range of motion.     Cervical back: Normal range of motion and neck supple.  Lymphadenopathy:     Cervical: No cervical adenopathy.  Skin:    General: Skin is warm and dry.  Neurological:     Mental Status: She is alert and oriented to person, place, and time.     Cranial Nerves: No cranial nerve deficit.  Psychiatric:        Mood and Affect: Mood normal.        Behavior: Behavior normal.        Thought Content: Thought content normal.        Judgment: Judgment normal.    Results for orders placed or performed in visit on 09/02/23  TSH  Result Value Ref Range   TSH 1.910 0.450 - 4.500 uIU/mL  Lipid panel  Result Value Ref Range   Cholesterol, Total 189 100 - 199 mg/dL   Triglycerides 696 (H) 0 - 149 mg/dL   HDL 42 >29 mg/dL   VLDL Cholesterol Cal 34 5 - 40 mg/dL   LDL Chol Calc (NIH) 528 (H) 0 - 99 mg/dL   Chol/HDL Ratio 4.5 (H) 0.0 - 4.4 ratio  CBC with Differential/Platelet  Result Value Ref Range   WBC 10.5 3.4 - 10.8 x10E3/uL   RBC 4.87 3.77 - 5.28 x10E6/uL   Hemoglobin 12.3 11.1 - 15.9 g/dL   Hematocrit 41.3 24.4 - 46.6 %   MCV 82 79 - 97 fL   MCH 25.3 (L) 26.6 - 33.0 pg    MCHC 30.9 (L) 31.5 - 35.7 g/dL   RDW 01.0 27.2 - 53.6 %   Platelets 281 150 - 450 x10E3/uL   Neutrophils 57 Not Estab. %  Lymphs 30 Not Estab. %   Monocytes 9 Not Estab. %   Eos 3 Not Estab. %   Basos 0 Not Estab. %   Neutrophils Absolute 6.0 1.4 - 7.0 x10E3/uL   Lymphocytes Absolute 3.2 (H) 0.7 - 3.1 x10E3/uL   Monocytes Absolute 0.9 0.1 - 0.9 x10E3/uL   EOS (ABSOLUTE) 0.4 0.0 - 0.4 x10E3/uL   Basophils Absolute 0.0 0.0 - 0.2 x10E3/uL   Immature Granulocytes 1 Not Estab. %   Immature Grans (Abs) 0.1 0.0 - 0.1 x10E3/uL  CMP14+EGFR  Result Value Ref Range   Glucose 163 (H) 70 - 99 mg/dL   BUN 12 6 - 24 mg/dL   Creatinine, Ser 1.61 0.57 - 1.00 mg/dL   eGFR 096 >04 VW/UJW/1.19   BUN/Creatinine Ratio 18 9 - 23   Sodium 141 134 - 144 mmol/L   Potassium 4.5 3.5 - 5.2 mmol/L   Chloride 101 96 - 106 mmol/L   CO2 25 20 - 29 mmol/L   Calcium 10.1 8.7 - 10.2 mg/dL   Total Protein 6.4 6.0 - 8.5 g/dL   Albumin 4.1 3.9 - 4.9 g/dL   Globulin, Total 2.3 1.5 - 4.5 g/dL   Bilirubin Total <1.4 0.0 - 1.2 mg/dL   Alkaline Phosphatase 84 44 - 121 IU/L   AST 40 0 - 40 IU/L   ALT 48 (H) 0 - 32 IU/L  POCT UA - Microalbumin  Result Value Ref Range   Microalbumin Ur, POC 30 mg/L   Creatinine, POC 100 mg/dL   Albumin/Creatinine Ratio, Urine, POC <30   POCT HgB A1C  Result Value Ref Range   Hemoglobin A1C 7.3 (A) 4.0 - 5.6 %   HbA1c POC (<> result, manual entry)     HbA1c, POC (prediabetic range)     HbA1c, POC (controlled diabetic range) 7.3 (A) 0.0 - 7.0 %       Assessment & Plan:    Routine Health Maintenance and Physical Exam  Immunization History  Administered Date(s) Administered   Influenza Split 05/10/2014   Influenza, Seasonal, Injecte, Preservative Fre 04/07/2023   Influenza,inj,Quad PF,6+ Mos 05/18/2018, 04/11/2019, 04/11/2020, 08/18/2022   Influenza-Unspecified 06/29/2011, 06/10/2021   MMR 06/20/2014   Moderna Sars-Covid-2 Vaccination 08/15/2019, 09/12/2019    Pfizer(Comirnaty)Fall Seasonal Vaccine 12 years and older 08/18/2022   Tdap 01/09/2015    Health Maintenance  Topic Date Due   Pneumococcal Vaccine 22-55 Years old (1 of 2 - PCV) Never done   COVID-19 Vaccine (4 - 2024-25 season) 09/18/2023 (Originally 04/11/2023)   HEMOGLOBIN A1C  03/01/2024   FOOT EXAM  04/06/2024   OPHTHALMOLOGY EXAM  06/29/2024   Diabetic kidney evaluation - eGFR measurement  09/01/2024   Diabetic kidney evaluation - Urine ACR  09/01/2024   DTaP/Tdap/Td (2 - Td or Tdap) 01/08/2025   Cervical Cancer Screening (HPV/Pap Cotest)  04/11/2025   INFLUENZA VACCINE  Completed   HIV Screening  Completed   HPV VACCINES  Aged Out   Hepatitis C Screening  Discontinued    Discussed health benefits of physical activity, and encouraged her to engage in regular exercise appropriate for her age and condition.  1. Annual physical exam (Primary) Checking labs as below.  Up-to-date on preventative care.  Wellness information provided with AVS. - Lipid panel - CBC with Differential/Platelet - CMP14+EGFR  2. Type 2 diabetes mellitus without complication, without long-term current use of insulin (HCC) POCT microalbumin normal.  POCT hemoglobin A1c up to 7.3%.  Patient reports that she has been unable to  obtain her Ozempic but has now restarted over the past 3 weeks and will be working up to her previous dose.  Continue pravastatin and Ozempic. - POCT UA - Microalbumin - POCT HgB A1C - pravastatin (PRAVACHOL) 20 MG tablet; Take 1 tablet (20 mg total) by mouth daily.  Dispense: 90 tablet; Refill: 3  3. Essential hypertension Checking labs.  Continue lisinopril 20 mg daily.  Blood pressure at goal. - Lipid panel - CBC with Differential/Platelet - CMP14+EGFR  4. Prothrombin gene mutation (HCC) 5. History of pulmonary embolism 6. History of DVT (deep vein thrombosis) Continue Eliquis 2.5 mg twice daily as prescribed. - apixaban (ELIQUIS) 2.5 MG TABS tablet; Take 1 tablet (2.5 mg  total) by mouth 2 (two) times daily.  Dispense: 180 tablet; Refill: 3  7. Weight gain Checking TSH. - TSH  8. Obesity, morbid (HCC) Reviewed recommendations for regular intentional exercise, a low calorie diet, and weight loss to a healthy weight.  9. Hyperlipidemia associated with type 2 diabetes mellitus (HCC) Checking lipids.  Continue pravastatin.  10. Pleurisy Exam is benign.  Getting chest x-ray for further evaluation. - DG Chest 2 View; Future  11.  Breast cancer screening Mammogram ordered. - MM DIGITAL SCREENING BILATERAL; Future   Return in about 3 months (around 12/01/2023) for DM follow up.     Christen Butter, NP

## 2023-09-03 ENCOUNTER — Encounter: Payer: Self-pay | Admitting: Medical-Surgical

## 2023-09-03 LAB — CBC WITH DIFFERENTIAL/PLATELET
Basophils Absolute: 0 10*3/uL (ref 0.0–0.2)
Basos: 0 %
EOS (ABSOLUTE): 0.4 10*3/uL (ref 0.0–0.4)
Eos: 3 %
Hematocrit: 39.8 % (ref 34.0–46.6)
Hemoglobin: 12.3 g/dL (ref 11.1–15.9)
Immature Grans (Abs): 0.1 10*3/uL (ref 0.0–0.1)
Immature Granulocytes: 1 %
Lymphocytes Absolute: 3.2 10*3/uL — ABNORMAL HIGH (ref 0.7–3.1)
Lymphs: 30 %
MCH: 25.3 pg — ABNORMAL LOW (ref 26.6–33.0)
MCHC: 30.9 g/dL — ABNORMAL LOW (ref 31.5–35.7)
MCV: 82 fL (ref 79–97)
Monocytes Absolute: 0.9 10*3/uL (ref 0.1–0.9)
Monocytes: 9 %
Neutrophils Absolute: 6 10*3/uL (ref 1.4–7.0)
Neutrophils: 57 %
Platelets: 281 10*3/uL (ref 150–450)
RBC: 4.87 x10E6/uL (ref 3.77–5.28)
RDW: 14.2 % (ref 11.7–15.4)
WBC: 10.5 10*3/uL (ref 3.4–10.8)

## 2023-09-03 LAB — CMP14+EGFR
ALT: 48 [IU]/L — ABNORMAL HIGH (ref 0–32)
AST: 40 [IU]/L (ref 0–40)
Albumin: 4.1 g/dL (ref 3.9–4.9)
Alkaline Phosphatase: 84 [IU]/L (ref 44–121)
BUN/Creatinine Ratio: 18 (ref 9–23)
BUN: 12 mg/dL (ref 6–24)
Bilirubin Total: <0.2 mg/dL (ref 0.0–1.2)
CO2: 25 mmol/L (ref 20–29)
Calcium: 10.1 mg/dL (ref 8.7–10.2)
Chloride: 101 mmol/L (ref 96–106)
Creatinine, Ser: 0.67 mg/dL (ref 0.57–1.00)
Globulin, Total: 2.3 g/dL (ref 1.5–4.5)
Glucose: 163 mg/dL — ABNORMAL HIGH (ref 70–99)
Potassium: 4.5 mmol/L (ref 3.5–5.2)
Sodium: 141 mmol/L (ref 134–144)
Total Protein: 6.4 g/dL (ref 6.0–8.5)
eGFR: 112 mL/min/{1.73_m2} (ref >59)

## 2023-09-03 LAB — TSH: TSH: 1.91 u[IU]/mL (ref 0.450–4.500)

## 2023-09-03 LAB — LIPID PANEL
Chol/HDL Ratio: 4.5 {ratio} — ABNORMAL HIGH (ref 0.0–4.4)
Cholesterol, Total: 189 mg/dL (ref 100–199)
HDL: 42 mg/dL (ref >39)
LDL Chol Calc (NIH): 113 mg/dL — ABNORMAL HIGH (ref 0–99)
Triglycerides: 197 mg/dL — ABNORMAL HIGH (ref 0–149)
VLDL Cholesterol Cal: 34 mg/dL (ref 5–40)

## 2023-09-04 ENCOUNTER — Encounter: Payer: Self-pay | Admitting: Medical-Surgical

## 2023-09-04 DIAGNOSIS — E119 Type 2 diabetes mellitus without complications: Secondary | ICD-10-CM

## 2023-09-04 DIAGNOSIS — E611 Iron deficiency: Secondary | ICD-10-CM

## 2023-09-06 MED ORDER — PRAVASTATIN SODIUM 40 MG PO TABS
40.0000 mg | ORAL_TABLET | Freq: Every day | ORAL | 3 refills | Status: DC
Start: 2023-09-06 — End: 2024-03-17

## 2023-09-08 LAB — IRON,TIBC AND FERRITIN PANEL
Ferritin: 50 ng/mL (ref 15–150)
Iron Saturation: 9 % — CL (ref 15–55)
Iron: 30 ug/dL (ref 27–159)
Total Iron Binding Capacity: 350 ug/dL (ref 250–450)
UIBC: 320 ug/dL (ref 131–425)

## 2023-09-08 LAB — SPECIMEN STATUS REPORT

## 2023-09-13 ENCOUNTER — Encounter: Payer: Self-pay | Admitting: Medical-Surgical

## 2023-10-27 ENCOUNTER — Ambulatory Visit: Payer: BLUE CROSS/BLUE SHIELD

## 2023-11-11 ENCOUNTER — Ambulatory Visit

## 2023-11-11 DIAGNOSIS — Z1231 Encounter for screening mammogram for malignant neoplasm of breast: Secondary | ICD-10-CM

## 2023-11-15 ENCOUNTER — Encounter: Payer: Self-pay | Admitting: Medical-Surgical

## 2023-12-02 ENCOUNTER — Other Ambulatory Visit (HOSPITAL_COMMUNITY): Payer: Self-pay

## 2023-12-02 ENCOUNTER — Encounter: Payer: Self-pay | Admitting: Medical-Surgical

## 2023-12-02 ENCOUNTER — Telehealth: Payer: Self-pay

## 2023-12-02 ENCOUNTER — Ambulatory Visit (INDEPENDENT_AMBULATORY_CARE_PROVIDER_SITE_OTHER): Payer: BLUE CROSS/BLUE SHIELD | Admitting: Medical-Surgical

## 2023-12-02 VITALS — BP 122/84 | HR 101 | Resp 20 | Ht 66.0 in | Wt 275.0 lb

## 2023-12-02 DIAGNOSIS — G4733 Obstructive sleep apnea (adult) (pediatric): Secondary | ICD-10-CM | POA: Diagnosis not present

## 2023-12-02 DIAGNOSIS — F900 Attention-deficit hyperactivity disorder, predominantly inattentive type: Secondary | ICD-10-CM | POA: Diagnosis not present

## 2023-12-02 DIAGNOSIS — E119 Type 2 diabetes mellitus without complications: Secondary | ICD-10-CM | POA: Diagnosis not present

## 2023-12-02 LAB — POCT GLYCOSYLATED HEMOGLOBIN (HGB A1C)
HbA1c, POC (controlled diabetic range): 7.5 % — AB (ref 0.0–7.0)
Hemoglobin A1C: 7.5 % — AB (ref 4.0–5.6)

## 2023-12-02 MED ORDER — BUPROPION HCL ER (XL) 300 MG PO TB24: 300.0000 mg | ORAL_TABLET | Freq: Every day | ORAL | 3 refills | Status: DC

## 2023-12-02 MED ORDER — TIRZEPATIDE 5 MG/0.5ML ~~LOC~~ SOAJ
5.0000 mg | SUBCUTANEOUS | 0 refills | Status: DC
Start: 2023-12-02 — End: 2024-01-04

## 2023-12-02 MED ORDER — BLOOD GLUCOSE MONITORING SUPPL DEVI
1.0000 | Freq: Three times a day (TID) | 0 refills | Status: DC
Start: 2023-12-02 — End: 2023-12-28

## 2023-12-02 MED ORDER — BLOOD GLUCOSE TEST VI STRP
1.0000 | ORAL_STRIP | Freq: Two times a day (BID) | 11 refills | Status: AC | PRN
Start: 2023-12-02 — End: ?

## 2023-12-02 MED ORDER — LANCET DEVICE MISC: 1.0000 | Freq: Two times a day (BID) | 0 refills | Status: AC | PRN

## 2023-12-02 MED ORDER — LISDEXAMFETAMINE DIMESYLATE 20 MG PO CAPS: 20.0000 mg | ORAL_CAPSULE | Freq: Every day | ORAL | 0 refills | Status: DC

## 2023-12-02 MED ORDER — LANCETS MISC. MISC
1.0000 | Freq: Three times a day (TID) | 11 refills | Status: AC
Start: 2023-12-02 — End: ?

## 2023-12-02 NOTE — Progress Notes (Signed)
 Established patient visit  History, exam, impression, and plan:  1. Type 2 diabetes mellitus without complication, without long-term current use of insulin (HCC) (Primary) Pleasant 43 year old female presenting today for follow-up on type 2 diabetes.  Her last hemoglobin A1c was 7.3% about 3 months ago.  Has not been checking sugars and reports that she does not have a glucometer.  Has been working on dietary modifications as well as increasing physical activity.  Taking Ozempic  0.5 mg weekly, tolerating well without side effects.  Unfortunately, reports that she has difficulty with food noise and tends to snack and graze throughout the day.  Trying to make these snacks healthier whenever possible.  Does not feel like the Ozempic  is helping at all and is interested in switching over to Mounjaro .  Recheck of hemoglobin A1c at 7.5%, uncontrolled.  Discontinue Ozempic .  Switch to Mounjaro  5 mg weekly.  Work on dietary adherence.  Make sure to continue regular intentional exercise.  Prescription for glucometer and associated supplies sent to the pharmacy.  Would like her to check fasting sugars 2-3 times weekly with a goal of 90-120. - Blood Glucose Monitoring Suppl DEVI; 1 each by Does not apply route in the morning, at noon, and at bedtime. May substitute to any manufacturer covered by patient's insurance.  Dispense: 1 each; Refill: 0 - Glucose Blood (BLOOD GLUCOSE TEST STRIPS) STRP; 1 each by In Vitro route 2 (two) times daily as needed. May substitute to any manufacturer covered by patient's insurance.  Dispense: 100 strip; Refill: 11 - Lancet Device MISC; 1 each by Does not apply route 2 (two) times daily as needed. May substitute to any manufacturer covered by patient's insurance.  Dispense: 1 each; Refill: 0 - Lancets Misc. MISC; 1 each by Does not apply route in the morning, at noon, and at bedtime. May substitute to any manufacturer covered by patient's insurance.  Dispense: 100 each; Refill:  11 - tirzepatide  (MOUNJARO ) 5 MG/0.5ML Pen; Inject 5 mg into the skin once a week.  Dispense: 2 mL; Refill: 0  2. ADHD, predominantly inattentive type History of ADHD and has been taking Wellbutrin  150 mg daily.  For the first couple of weeks, she felt the medication worked very well to help with food noise as well as ADHD.  Was able to focus and complete tasks without significant difficulty.  Unfortunately, now that she is past the first couple of weeks, she has noticed the benefit is now reduced.  It has helped with smoking cessation however she is interested in increasing the medication for further assistance with ADHD.  No side effects or intolerances to Wellbutrin  noted.  For ADHD, she had tried Adderall in the past and did not like this at all.  We briefly discussed using Vyvanse  as this helps with food noise and ADHD and is used in the treatment of binge eating disorder.  Increasing Wellbutrin  to 300 mg daily.  She is interested in Vyvanse  and would like to give this a try at a very low dose.  Starting Vyvanse  20 mg daily. - buPROPion  (WELLBUTRIN  XL) 300 MG 24 hr tablet; Take 1 tablet (300 mg total) by mouth daily.  Dispense: 90 tablet; Refill: 3 - lisdexamfetamine (VYVANSE ) 20 MG capsule; Take 1 capsule (20 mg total) by mouth daily.  Dispense: 30 capsule; Refill: 0  3. OSA (obstructive sleep apnea) Long history of sleep apnea with her last sleep study being approximately 7 years ago.  She has a CPAP  that she uses nightly however it is old and not functioning as well as desired.  She would like to get a new CPAP if possible.  Since her last sleep study is not available on file and it is outdated, we will need to repeat this.  Initially, she was hesitant however she agreed to do a home sleep study.  Ordering today. - Home sleep test; Future  Procedures performed this visit: None.  Return in about 3 months (around 03/02/2024) for DM/HTN/HLD follow up.  __________________________________ Maryl Snook, DNP, APRN, FNP-BC Primary Care and Sports Medicine [facility] [place]

## 2023-12-02 NOTE — Telephone Encounter (Signed)
 Pharmacy Patient Advocate Encounter   Received notification from Patient Pharmacy that prior authorization for Vyvanse  20 is required/requested.   Insurance verification completed.   The patient is insured through [facility] .   Per test claim: PA required; PA submitted to above mentioned insurance via CoverMyMeds Key/confirmation #/EOC [id number] Status is pending

## 2023-12-02 NOTE — Telephone Encounter (Signed)
 Pharmacy Patient Advocate Encounter  Received notification from [facility] that Prior Authorization for Vyvanse  20 has been APPROVED from 12/02/23 to 12/01/26. Ran test claim, Copay is $15.00. This test claim was processed through [facility]- copay amounts may vary at other pharmacies due to pharmacy/plan contracts, or as the patient moves through the different stages of their insurance plan.   PA #/Case ID/Reference #: [id number]

## 2023-12-06 ENCOUNTER — Other Ambulatory Visit (HOSPITAL_COMMUNITY): Payer: Self-pay

## 2023-12-28 ENCOUNTER — Other Ambulatory Visit: Payer: Self-pay | Admitting: Medical-Surgical

## 2023-12-28 DIAGNOSIS — E119 Type 2 diabetes mellitus without complications: Secondary | ICD-10-CM

## 2023-12-30 ENCOUNTER — Encounter: Payer: Self-pay | Admitting: Medical-Surgical

## 2023-12-30 DIAGNOSIS — F900 Attention-deficit hyperactivity disorder, predominantly inattentive type: Secondary | ICD-10-CM

## 2023-12-30 DIAGNOSIS — E119 Type 2 diabetes mellitus without complications: Secondary | ICD-10-CM

## 2023-12-30 DIAGNOSIS — R109 Unspecified abdominal pain: Secondary | ICD-10-CM

## 2024-01-01 MED ORDER — LISDEXAMFETAMINE DIMESYLATE 20 MG PO CAPS: 20.0000 mg | ORAL_CAPSULE | Freq: Every day | ORAL | 0 refills | Status: DC

## 2024-01-01 MED ORDER — LISDEXAMFETAMINE DIMESYLATE 20 MG PO CAPS
20.0000 mg | ORAL_CAPSULE | Freq: Every day | ORAL | 0 refills | Status: DC
Start: 2024-01-01 — End: 2024-03-16

## 2024-01-04 MED ORDER — TIRZEPATIDE 5 MG/0.5ML ~~LOC~~ SOAJ
5.0000 mg | SUBCUTANEOUS | 1 refills | Status: DC
Start: 2024-01-04 — End: 2024-03-17

## 2024-01-04 NOTE — Addendum Note (Signed)
 Addended byCherre Cornish on: 01/04/2024 08:16 AM   Modules accepted: Orders

## 2024-03-02 ENCOUNTER — Ambulatory Visit: Admitting: Medical-Surgical

## 2024-03-02 ENCOUNTER — Encounter: Payer: Self-pay | Admitting: Medical-Surgical

## 2024-03-02 DIAGNOSIS — F900 Attention-deficit hyperactivity disorder, predominantly inattentive type: Secondary | ICD-10-CM

## 2024-03-02 MED ORDER — LISDEXAMFETAMINE DIMESYLATE 20 MG PO CAPS
20.0000 mg | ORAL_CAPSULE | Freq: Every day | ORAL | 0 refills | Status: DC
Start: 2024-03-02 — End: 2024-03-16

## 2024-03-02 NOTE — Telephone Encounter (Signed)
 Requesting rx rf of Vyvanse  20mg   Last written 01/31/2024 Last OV 12/02/2023 Upcoming appt 03/17/2024

## 2024-03-16 NOTE — Progress Notes (Unsigned)
 Established patient visit   History of Present Illness   Discussed the use of AI scribe software for clinical note transcription with the patient, who gave verbal consent to proceed.  History of Present Illness   Lorraine Lynch is a 43 year old female with type 2 diabetes who presents for a follow-up visit.  She manages her type 2 diabetes with home glucose monitoring and medication. Fasting blood sugars are usually 90 or below. Her last A1c in April was 7.5. She is currently on Mounjaro  5mg  daily, which she finds more tolerable than Ozempic  due to fewer side effects. She has lost 31 pounds since April, going from 275 to 246 pounds, which she attributes to her current medication regimen.  She takes lisinopril  10 mg daily for blood pressure management but does not check her blood pressure at home. She is also on Eliquis  indefinitely and takes Wellbutrin  and Vyvanse  20 mg daily. No palpitations, chest pain, or shortness of breath.  She experiences occasional sleep disturbances, waking up at 4 AM and being unable to return to sleep, but otherwise sleeps well. She has significantly reduced her smoking and does not vape.        Physical Exam   Physical Exam Vitals reviewed.  Constitutional:      General: She is not in acute distress.    Appearance: Normal appearance. She is obese. She is not ill-appearing.  HENT:     Head: Normocephalic and atraumatic.  Cardiovascular:     Rate and Rhythm: Normal rate and regular rhythm.     Pulses: Normal pulses.     Heart sounds: Normal heart sounds. No murmur heard.    No friction rub. No gallop.  Pulmonary:     Effort: Pulmonary effort is normal. No respiratory distress.     Breath sounds: Normal breath sounds. No wheezing.  Skin:    General: Skin is warm and dry.  Neurological:     Mental Status: She is alert and oriented to person, place, and time.  Psychiatric:        Mood and Affect: Mood normal.        Behavior: Behavior  normal.        Thought Content: Thought content normal.        Judgment: Judgment normal.     Assessment & Plan   Assessment and Plan    Type 2 diabetes mellitus Well-controlled with fasting blood sugars typically 90 or below. Hemoglobin A1c improved from 7.5% to 5.8%. Significant weight loss of 31 pounds since April likely contributing to improved glycemic control. - Continue Mounjaro  at 5 mg. - Continue glucose monitoring at home. - Schedule follow-up in 6 months.  Essential hypertension - Continue lisinopril  10 mg daily.  Hyperlipidemia Management includes pravastatin . Recent dose increase may affect liver enzymes, necessitating monitoring. - Order blood work to monitor liver enzymes and lipid levels. - Continue Pravastatin .  Major depressive disorder Well-managed on current medication regimen. - Continue Wellbutrin .  Attention-deficit hyperactivity disorder Well-managed on current medication regimen. No side effects reported. - Continue Vyvanse  20 mg.  Tobacco use Reports minimal smoking, denies vaping. - Smoking cessation recommended.  General Health Maintenance Due for a pneumonia vaccine due to updated guidelines for individuals with diabetes. - Administer Prevnar 20 vaccine.      Follow up   Return in about 6 months (around 09/17/2024) for DM/HTN/HLD follow up.  __________________________________ Zada FREDRIK Palin, DNP, APRN, FNP-BC Primary Care and Sports Medicine [facility]  [facility]

## 2024-03-17 ENCOUNTER — Encounter: Payer: Self-pay | Admitting: Medical-Surgical

## 2024-03-17 ENCOUNTER — Ambulatory Visit (INDEPENDENT_AMBULATORY_CARE_PROVIDER_SITE_OTHER): Admitting: Medical-Surgical

## 2024-03-17 VITALS — BP 117/76 | HR 94 | Resp 20 | Ht 66.0 in | Wt 246.1 lb

## 2024-03-17 DIAGNOSIS — Z86718 Personal history of other venous thrombosis and embolism: Secondary | ICD-10-CM

## 2024-03-17 DIAGNOSIS — I10 Essential (primary) hypertension: Secondary | ICD-10-CM | POA: Diagnosis not present

## 2024-03-17 DIAGNOSIS — Z7985 Long-term (current) use of injectable non-insulin antidiabetic drugs: Secondary | ICD-10-CM

## 2024-03-17 DIAGNOSIS — E119 Type 2 diabetes mellitus without complications: Secondary | ICD-10-CM | POA: Diagnosis not present

## 2024-03-17 DIAGNOSIS — F900 Attention-deficit hyperactivity disorder, predominantly inattentive type: Secondary | ICD-10-CM | POA: Diagnosis not present

## 2024-03-17 DIAGNOSIS — Z23 Encounter for immunization: Secondary | ICD-10-CM

## 2024-03-17 DIAGNOSIS — Z86711 Personal history of pulmonary embolism: Secondary | ICD-10-CM

## 2024-03-17 DIAGNOSIS — E611 Iron deficiency: Secondary | ICD-10-CM

## 2024-03-17 DIAGNOSIS — E1169 Type 2 diabetes mellitus with other specified complication: Secondary | ICD-10-CM

## 2024-03-17 DIAGNOSIS — D6852 Prothrombin gene mutation: Secondary | ICD-10-CM

## 2024-03-17 DIAGNOSIS — E785 Hyperlipidemia, unspecified: Secondary | ICD-10-CM

## 2024-03-17 LAB — POCT GLYCOSYLATED HEMOGLOBIN (HGB A1C)
HbA1c, POC (controlled diabetic range): 5.8 % (ref 0.0–7.0)
Hemoglobin A1C: 5.8 % — AB (ref 4.0–5.6)

## 2024-03-17 MED ORDER — PRAVASTATIN SODIUM 40 MG PO TABS: 40.0000 mg | ORAL_TABLET | Freq: Every day | ORAL | 3 refills | Status: AC

## 2024-03-17 MED ORDER — LISINOPRIL 10 MG PO TABS: 20.0000 mg | ORAL_TABLET | Freq: Every day | ORAL | 3 refills | Status: DC

## 2024-03-17 MED ORDER — LISDEXAMFETAMINE DIMESYLATE 20 MG PO CAPS: 20.0000 mg | ORAL_CAPSULE | Freq: Every day | ORAL | 0 refills | Status: DC

## 2024-03-17 MED ORDER — BUPROPION HCL ER (XL) 300 MG PO TB24: 300.0000 mg | ORAL_TABLET | Freq: Every day | ORAL | 3 refills | Status: AC

## 2024-03-17 MED ORDER — APIXABAN 2.5 MG PO TABS: 2.5000 mg | ORAL_TABLET | Freq: Two times a day (BID) | ORAL | 3 refills | Status: AC

## 2024-03-17 MED ORDER — TIRZEPATIDE 5 MG/0.5ML ~~LOC~~ SOAJ: 5.0000 mg | SUBCUTANEOUS | 1 refills | Status: AC

## 2024-03-18 LAB — CMP14+EGFR
ALT: 41 IU/L — ABNORMAL HIGH (ref 0–32)
AST: 25 IU/L (ref 0–40)
Albumin: 4.4 g/dL (ref 3.9–4.9)
Alkaline Phosphatase: 94 IU/L (ref 44–121)
BUN/Creatinine Ratio: 12 (ref 9–23)
BUN: 17 mg/dL (ref 6–24)
Bilirubin Total: 0.3 mg/dL (ref 0.0–1.2)
CO2: 21 mmol/L (ref 20–29)
Calcium: 9.3 mg/dL (ref 8.7–10.2)
Chloride: 103 mmol/L (ref 96–106)
Creatinine, Ser: 1.39 mg/dL — ABNORMAL HIGH (ref 0.57–1.00)
Globulin, Total: 2.4 g/dL (ref 1.5–4.5)
Glucose: 95 mg/dL (ref 70–99)
Potassium: 4.3 mmol/L (ref 3.5–5.2)
Sodium: 140 mmol/L (ref 134–144)
Total Protein: 6.8 g/dL (ref 6.0–8.5)
eGFR: 48 mL/min/1.73 — ABNORMAL LOW (ref >59)

## 2024-03-18 LAB — CBC WITH DIFFERENTIAL/PLATELET
Basophils Absolute: 0 x10E3/uL (ref 0.0–0.2)
Basos: 0 %
EOS (ABSOLUTE): 0.3 x10E3/uL (ref 0.0–0.4)
Eos: 3 %
Hematocrit: 44.3 % (ref 34.0–46.6)
Hemoglobin: 13.7 g/dL (ref 11.1–15.9)
Immature Grans (Abs): 0 x10E3/uL (ref 0.0–0.1)
Immature Granulocytes: 0 %
Lymphocytes Absolute: 3.1 x10E3/uL (ref 0.7–3.1)
Lymphs: 31 %
MCH: 25.9 pg — ABNORMAL LOW (ref 26.6–33.0)
MCHC: 30.9 g/dL — ABNORMAL LOW (ref 31.5–35.7)
MCV: 84 fL (ref 79–97)
Monocytes Absolute: 0.7 x10E3/uL (ref 0.1–0.9)
Monocytes: 7 %
Neutrophils Absolute: 5.9 x10E3/uL (ref 1.4–7.0)
Neutrophils: 59 %
Platelets: 361 x10E3/uL (ref 150–450)
RBC: 5.29 x10E6/uL — ABNORMAL HIGH (ref 3.77–5.28)
RDW: 13.6 % (ref 11.7–15.4)
WBC: 10.1 x10E3/uL (ref 3.4–10.8)

## 2024-03-18 LAB — LIPID PANEL
Chol/HDL Ratio: 3.7 ratio (ref 0.0–4.4)
Cholesterol, Total: 153 mg/dL (ref 100–199)
HDL: 41 mg/dL (ref >39)
LDL Chol Calc (NIH): 84 mg/dL (ref 0–99)
Triglycerides: 163 mg/dL — ABNORMAL HIGH (ref 0–149)
VLDL Cholesterol Cal: 28 mg/dL (ref 5–40)

## 2024-03-18 LAB — IRON,TIBC AND FERRITIN PANEL
Ferritin: 64 ng/mL (ref 15–150)
Iron Saturation: 14 % — ABNORMAL LOW (ref 15–55)
Iron: 44 ug/dL (ref 27–159)
Total Iron Binding Capacity: 322 ug/dL (ref 250–450)
UIBC: 278 ug/dL (ref 131–425)

## 2024-03-20 ENCOUNTER — Ambulatory Visit: Payer: Self-pay | Admitting: Medical-Surgical

## 2024-03-20 DIAGNOSIS — N289 Disorder of kidney and ureter, unspecified: Secondary | ICD-10-CM

## 2024-05-26 ENCOUNTER — Other Ambulatory Visit: Payer: Self-pay | Admitting: Medical-Surgical

## 2024-05-26 NOTE — Telephone Encounter (Signed)
 Last filled 03/17/2024  Last OV 03/17/2024  Upcoming appointment 09/18/2024

## 2024-07-02 ENCOUNTER — Encounter: Payer: Self-pay | Admitting: Medical-Surgical

## 2024-07-02 DIAGNOSIS — F900 Attention-deficit hyperactivity disorder, predominantly inattentive type: Secondary | ICD-10-CM

## 2024-07-03 MED ORDER — LISDEXAMFETAMINE DIMESYLATE 20 MG PO CAPS: 20.0000 mg | ORAL_CAPSULE | Freq: Every day | ORAL | 0 refills | Status: AC

## 2024-09-18 ENCOUNTER — Ambulatory Visit: Admitting: Medical-Surgical
